# Patient Record
Sex: Female | Born: 1965
Health system: Southern US, Community
[De-identification: ages and names within clinical notes are randomized; demographics above are authoritative.]

## PROBLEM LIST (undated history)

## (undated) HISTORY — PX: ENDOMETRIAL ABLATION: SHX621

## (undated) HISTORY — PX: TUBAL LIGATION: SHX77

---

## 1999-01-26 ENCOUNTER — Other Ambulatory Visit: Admission: RE | Admit: 1999-01-26 | Discharge: 1999-01-26 | Payer: Self-pay | Admitting: Obstetrics and Gynecology

## 1999-04-22 ENCOUNTER — Ambulatory Visit (HOSPITAL_COMMUNITY): Admission: RE | Admit: 1999-04-22 | Discharge: 1999-04-22 | Payer: Self-pay | Admitting: Obstetrics and Gynecology

## 1999-06-19 ENCOUNTER — Inpatient Hospital Stay (HOSPITAL_COMMUNITY): Admission: AD | Admit: 1999-06-19 | Discharge: 1999-06-19 | Payer: Self-pay | Admitting: Obstetrics and Gynecology

## 1999-06-26 ENCOUNTER — Inpatient Hospital Stay (HOSPITAL_COMMUNITY): Admission: AD | Admit: 1999-06-26 | Discharge: 1999-07-12 | Payer: Self-pay | Admitting: *Deleted

## 1999-06-29 ENCOUNTER — Encounter: Payer: Self-pay | Admitting: Obstetrics and Gynecology

## 1999-07-06 ENCOUNTER — Encounter: Payer: Self-pay | Admitting: *Deleted

## 1999-07-13 ENCOUNTER — Encounter: Admission: RE | Admit: 1999-07-13 | Discharge: 1999-10-11 | Payer: Self-pay | Admitting: Obstetrics and Gynecology

## 1999-08-05 ENCOUNTER — Other Ambulatory Visit: Admission: RE | Admit: 1999-08-05 | Discharge: 1999-08-05 | Payer: Self-pay | Admitting: Obstetrics and Gynecology

## 1999-09-04 ENCOUNTER — Ambulatory Visit (HOSPITAL_COMMUNITY): Admission: RE | Admit: 1999-09-04 | Discharge: 1999-09-04 | Payer: Self-pay | Admitting: Obstetrics and Gynecology

## 1999-10-13 ENCOUNTER — Encounter: Admission: RE | Admit: 1999-10-13 | Discharge: 2000-01-11 | Payer: Self-pay | Admitting: Obstetrics and Gynecology

## 2000-02-10 ENCOUNTER — Encounter (HOSPITAL_COMMUNITY): Admission: RE | Admit: 2000-02-10 | Discharge: 2000-05-10 | Payer: Self-pay | Admitting: Obstetrics and Gynecology

## 2000-05-12 ENCOUNTER — Encounter: Admission: RE | Admit: 2000-05-12 | Discharge: 2000-08-10 | Payer: Self-pay | Admitting: Obstetrics and Gynecology

## 2000-08-11 ENCOUNTER — Encounter: Admission: RE | Admit: 2000-08-11 | Discharge: 2000-10-05 | Payer: Self-pay | Admitting: Obstetrics and Gynecology

## 2002-10-29 ENCOUNTER — Encounter: Payer: Self-pay | Admitting: Family Medicine

## 2002-10-29 ENCOUNTER — Encounter: Admission: RE | Admit: 2002-10-29 | Discharge: 2002-10-29 | Payer: Self-pay | Admitting: Family Medicine

## 2003-01-11 ENCOUNTER — Other Ambulatory Visit: Admission: RE | Admit: 2003-01-11 | Discharge: 2003-01-11 | Payer: Self-pay | Admitting: Obstetrics and Gynecology

## 2004-02-15 ENCOUNTER — Other Ambulatory Visit: Admission: RE | Admit: 2004-02-15 | Discharge: 2004-02-15 | Payer: Self-pay | Admitting: Obstetrics and Gynecology

## 2005-02-26 ENCOUNTER — Other Ambulatory Visit: Admission: RE | Admit: 2005-02-26 | Discharge: 2005-02-26 | Payer: Self-pay | Admitting: Obstetrics and Gynecology

## 2006-04-22 ENCOUNTER — Other Ambulatory Visit: Admission: RE | Admit: 2006-04-22 | Discharge: 2006-04-22 | Payer: Self-pay | Admitting: Obstetrics and Gynecology

## 2010-09-26 ENCOUNTER — Other Ambulatory Visit: Payer: Self-pay | Admitting: Obstetrics and Gynecology

## 2010-09-26 DIAGNOSIS — Z1231 Encounter for screening mammogram for malignant neoplasm of breast: Secondary | ICD-10-CM

## 2010-09-26 DIAGNOSIS — Z1239 Encounter for other screening for malignant neoplasm of breast: Secondary | ICD-10-CM

## 2010-10-14 ENCOUNTER — Ambulatory Visit
Admission: RE | Admit: 2010-10-14 | Discharge: 2010-10-14 | Disposition: A | Discharge status: Home / Self Care | Payer: 59 | Source: Ambulatory Visit | Attending: Obstetrics and Gynecology | Admitting: Obstetrics and Gynecology

## 2010-10-14 DIAGNOSIS — Z1231 Encounter for screening mammogram for malignant neoplasm of breast: Secondary | ICD-10-CM

## 2011-11-19 ENCOUNTER — Ambulatory Visit: Payer: Self-pay | Admitting: Obstetrics and Gynecology

## 2011-12-02 ENCOUNTER — Ambulatory Visit (INDEPENDENT_AMBULATORY_CARE_PROVIDER_SITE_OTHER): Payer: 59 | Admitting: Obstetrics and Gynecology

## 2011-12-02 DIAGNOSIS — Z01419 Encounter for gynecological examination (general) (routine) without abnormal findings: Secondary | ICD-10-CM

## 2011-12-31 ENCOUNTER — Other Ambulatory Visit: Payer: Self-pay | Admitting: Obstetrics and Gynecology

## 2011-12-31 ENCOUNTER — Telehealth: Payer: Self-pay | Admitting: Obstetrics and Gynecology

## 2011-12-31 DIAGNOSIS — Z1231 Encounter for screening mammogram for malignant neoplasm of breast: Secondary | ICD-10-CM

## 2011-12-31 NOTE — Telephone Encounter (Signed)
Routed to laura

## 2012-01-03 NOTE — Telephone Encounter (Signed)
LMTC @ 10:15  ld

## 2012-01-07 ENCOUNTER — Ambulatory Visit
Admission: RE | Admit: 2012-01-07 | Discharge: 2012-01-07 | Disposition: A | Discharge status: Home / Self Care | Payer: 59 | Source: Ambulatory Visit | Attending: Obstetrics and Gynecology | Admitting: Obstetrics and Gynecology

## 2012-01-07 DIAGNOSIS — Z1231 Encounter for screening mammogram for malignant neoplasm of breast: Secondary | ICD-10-CM

## 2012-01-10 ENCOUNTER — Telehealth: Payer: Self-pay | Admitting: Obstetrics and Gynecology

## 2012-01-10 NOTE — Telephone Encounter (Deleted)
Triage received 

## 2012-01-10 NOTE — Telephone Encounter (Signed)
Laura received 

## 2012-01-11 ENCOUNTER — Other Ambulatory Visit: Payer: Self-pay | Admitting: Obstetrics and Gynecology

## 2012-01-11 DIAGNOSIS — R928 Other abnormal and inconclusive findings on diagnostic imaging of breast: Secondary | ICD-10-CM

## 2012-01-11 NOTE — Telephone Encounter (Signed)
Explained ASCUS to patient.  ld

## 2012-01-14 ENCOUNTER — Ambulatory Visit
Admission: RE | Admit: 2012-01-14 | Discharge: 2012-01-14 | Disposition: A | Discharge status: Home / Self Care | Payer: 59 | Source: Ambulatory Visit | Attending: Obstetrics and Gynecology | Admitting: Obstetrics and Gynecology

## 2012-01-14 DIAGNOSIS — R928 Other abnormal and inconclusive findings on diagnostic imaging of breast: Secondary | ICD-10-CM

## 2013-01-30 ENCOUNTER — Other Ambulatory Visit: Payer: Self-pay | Admitting: Obstetrics and Gynecology

## 2013-01-30 DIAGNOSIS — Z1231 Encounter for screening mammogram for malignant neoplasm of breast: Secondary | ICD-10-CM

## 2013-03-06 ENCOUNTER — Ambulatory Visit: Payer: 59

## 2013-03-06 ENCOUNTER — Ambulatory Visit
Admission: RE | Admit: 2013-03-06 | Discharge: 2013-03-06 | Disposition: A | Discharge status: Home / Self Care | Payer: 59 | Source: Ambulatory Visit | Attending: Obstetrics and Gynecology | Admitting: Obstetrics and Gynecology

## 2013-03-06 DIAGNOSIS — Z1231 Encounter for screening mammogram for malignant neoplasm of breast: Secondary | ICD-10-CM

## 2014-03-22 ENCOUNTER — Other Ambulatory Visit: Payer: Self-pay

## 2014-03-22 DIAGNOSIS — Z1231 Encounter for screening mammogram for malignant neoplasm of breast: Secondary | ICD-10-CM

## 2014-04-03 ENCOUNTER — Ambulatory Visit
Admission: RE | Admit: 2014-04-03 | Discharge: 2014-04-03 | Disposition: A | Discharge status: Home / Self Care | Payer: 59 | Source: Ambulatory Visit

## 2014-04-03 DIAGNOSIS — Z1231 Encounter for screening mammogram for malignant neoplasm of breast: Secondary | ICD-10-CM

## 2014-04-26 ENCOUNTER — Other Ambulatory Visit: Payer: Self-pay | Admitting: Family Medicine

## 2014-04-26 DIAGNOSIS — N63 Unspecified lump in unspecified breast: Secondary | ICD-10-CM

## 2014-05-01 ENCOUNTER — Ambulatory Visit
Admission: RE | Admit: 2014-05-01 | Discharge: 2014-05-01 | Disposition: A | Discharge status: Home / Self Care | Payer: 59 | Source: Ambulatory Visit | Attending: Family Medicine | Admitting: Family Medicine

## 2014-05-01 DIAGNOSIS — N63 Unspecified lump in unspecified breast: Secondary | ICD-10-CM

## 2014-09-24 ENCOUNTER — Other Ambulatory Visit: Payer: Self-pay | Admitting: Obstetrics and Gynecology

## 2014-09-24 DIAGNOSIS — N63 Unspecified lump in unspecified breast: Secondary | ICD-10-CM

## 2014-09-26 ENCOUNTER — Ambulatory Visit
Admission: RE | Admit: 2014-09-26 | Discharge: 2014-09-26 | Disposition: A | Discharge status: Home / Self Care | Payer: 59 | Source: Ambulatory Visit | Attending: Obstetrics and Gynecology | Admitting: Obstetrics and Gynecology

## 2014-09-26 ENCOUNTER — Other Ambulatory Visit: Payer: Self-pay

## 2014-09-26 DIAGNOSIS — N63 Unspecified lump in unspecified breast: Secondary | ICD-10-CM

## 2014-10-01 ENCOUNTER — Other Ambulatory Visit: Payer: Self-pay

## 2015-04-25 ENCOUNTER — Other Ambulatory Visit: Payer: Self-pay

## 2015-04-25 DIAGNOSIS — Z1231 Encounter for screening mammogram for malignant neoplasm of breast: Secondary | ICD-10-CM

## 2015-05-20 ENCOUNTER — Ambulatory Visit: Payer: 59

## 2015-07-17 ENCOUNTER — Ambulatory Visit
Admission: RE | Admit: 2015-07-17 | Discharge: 2015-07-17 | Disposition: A | Discharge status: Home / Self Care | Payer: 59 | Source: Ambulatory Visit

## 2015-07-17 DIAGNOSIS — Z1231 Encounter for screening mammogram for malignant neoplasm of breast: Secondary | ICD-10-CM

## 2016-09-28 ENCOUNTER — Other Ambulatory Visit: Payer: Self-pay | Admitting: Obstetrics and Gynecology

## 2016-09-28 DIAGNOSIS — Z1231 Encounter for screening mammogram for malignant neoplasm of breast: Secondary | ICD-10-CM

## 2016-10-05 DIAGNOSIS — Z6821 Body mass index (BMI) 21.0-21.9, adult: Secondary | ICD-10-CM | POA: Diagnosis not present

## 2016-10-05 DIAGNOSIS — Z01419 Encounter for gynecological examination (general) (routine) without abnormal findings: Secondary | ICD-10-CM | POA: Diagnosis not present

## 2016-10-15 ENCOUNTER — Ambulatory Visit
Admission: RE | Admit: 2016-10-15 | Discharge: 2016-10-15 | Disposition: A | Discharge status: Home / Self Care | Payer: 59 | Source: Ambulatory Visit | Attending: Obstetrics and Gynecology | Admitting: Obstetrics and Gynecology

## 2016-10-15 DIAGNOSIS — Z1231 Encounter for screening mammogram for malignant neoplasm of breast: Secondary | ICD-10-CM | POA: Diagnosis not present

## 2016-10-15 IMAGING — MG DIGITAL SCREENING BILATERAL MAMMOGRAM WITH CAD
5 series · 5 of 5 positions shown · non-contrast
Comparison: Previous exam(s).

CLINICAL DATA: Screening.

EXAM:
DIGITAL SCREENING BILATERAL MAMMOGRAM WITH CAD

[R CC]
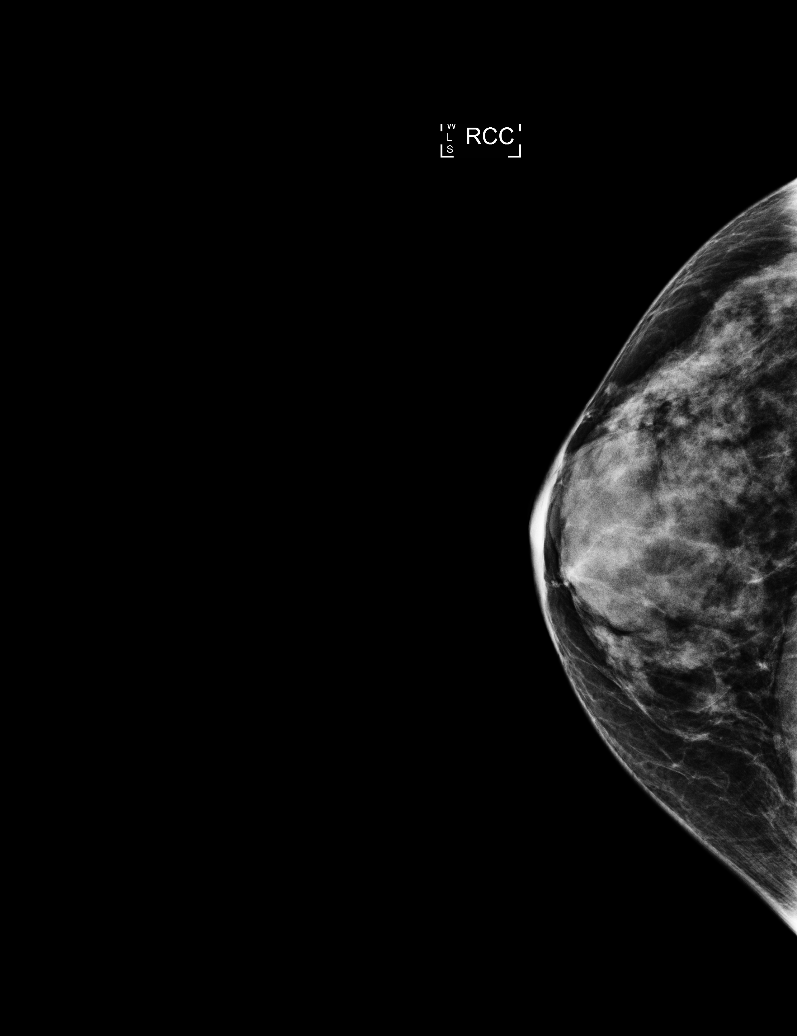

[L MLO (1 of 2)]
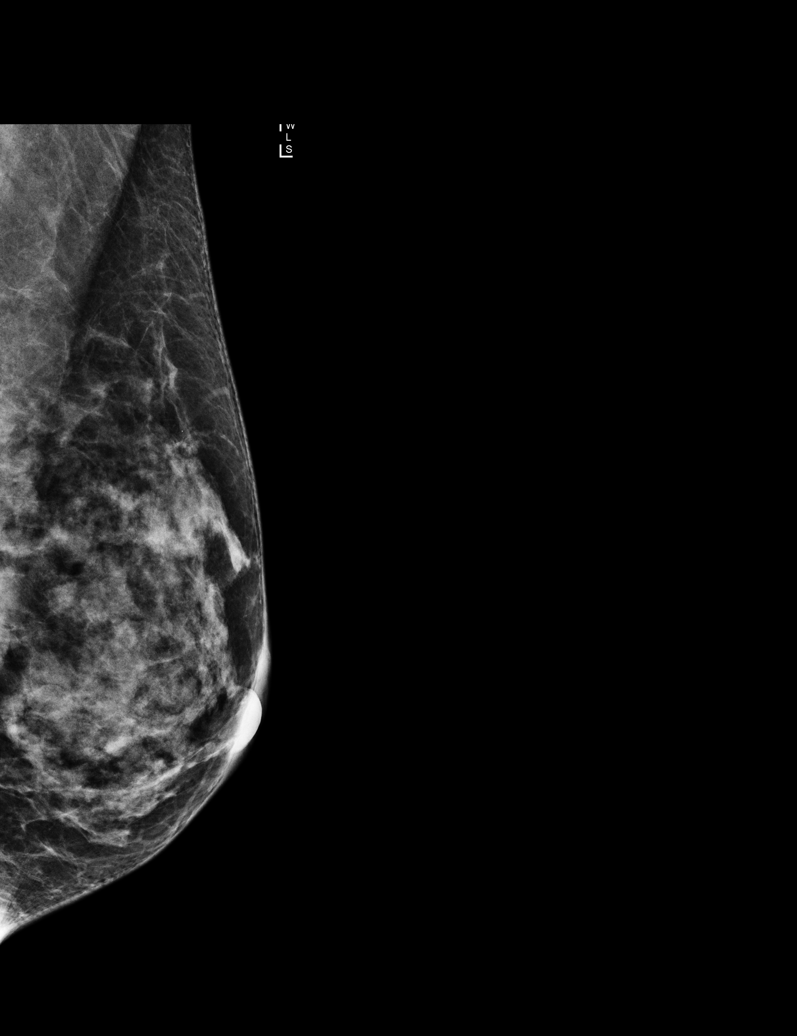

[L MLO (2 of 2)]
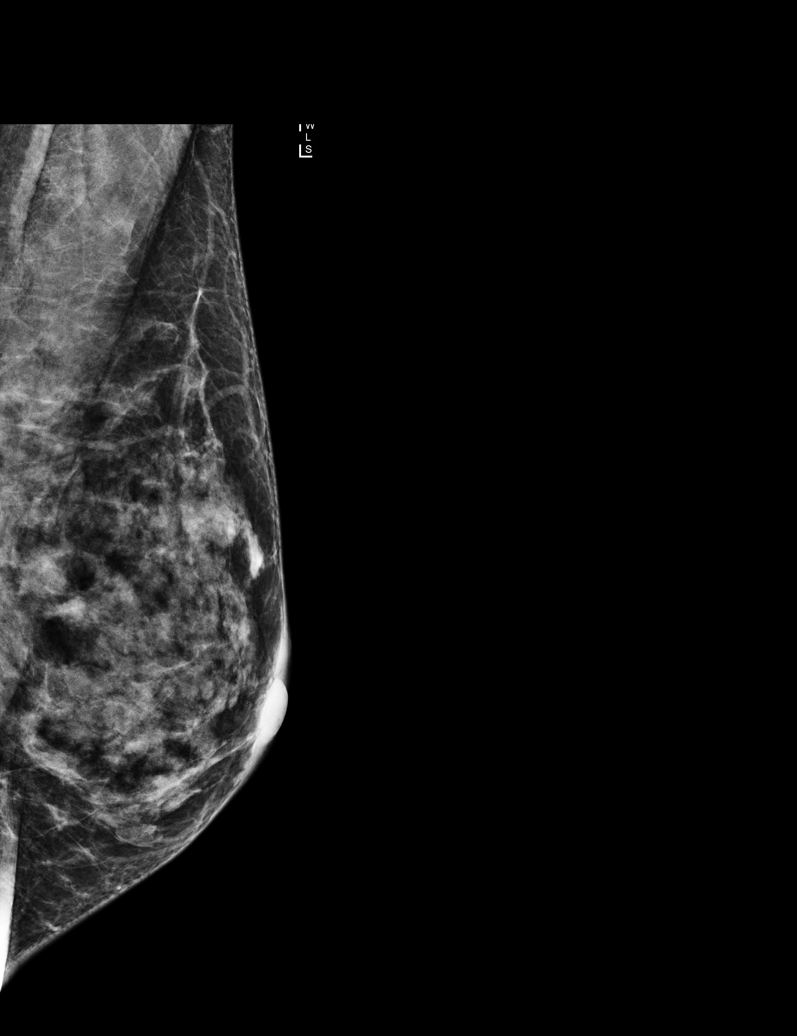

[R MLO]
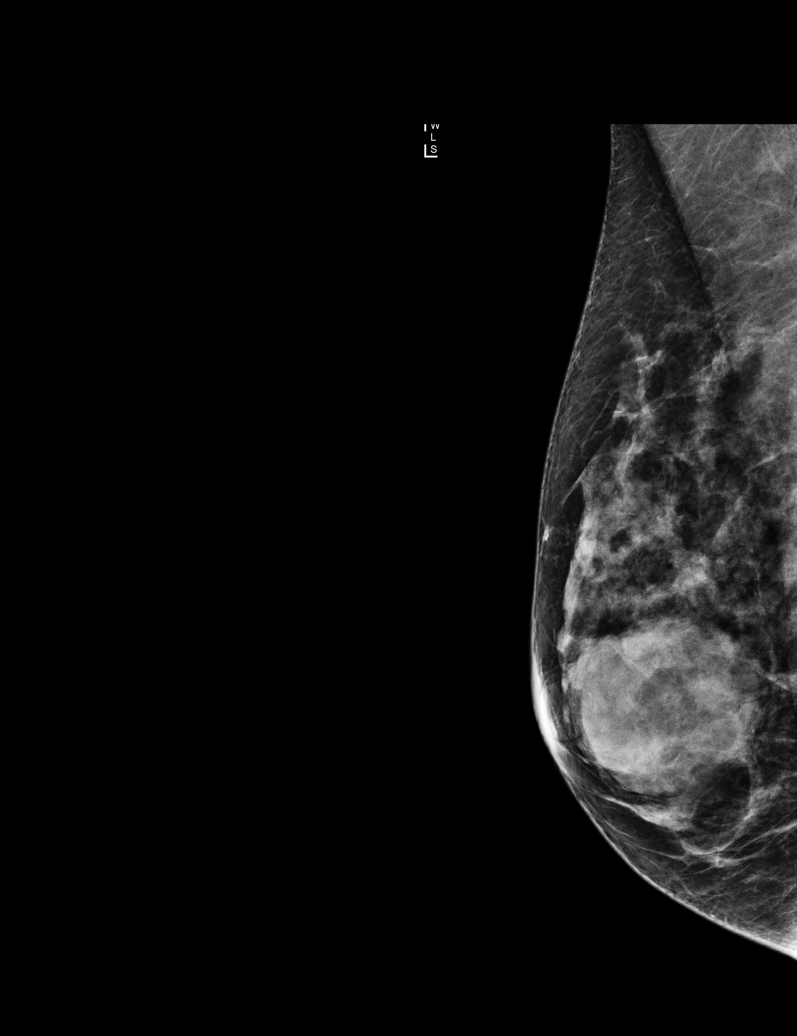

[L CC]
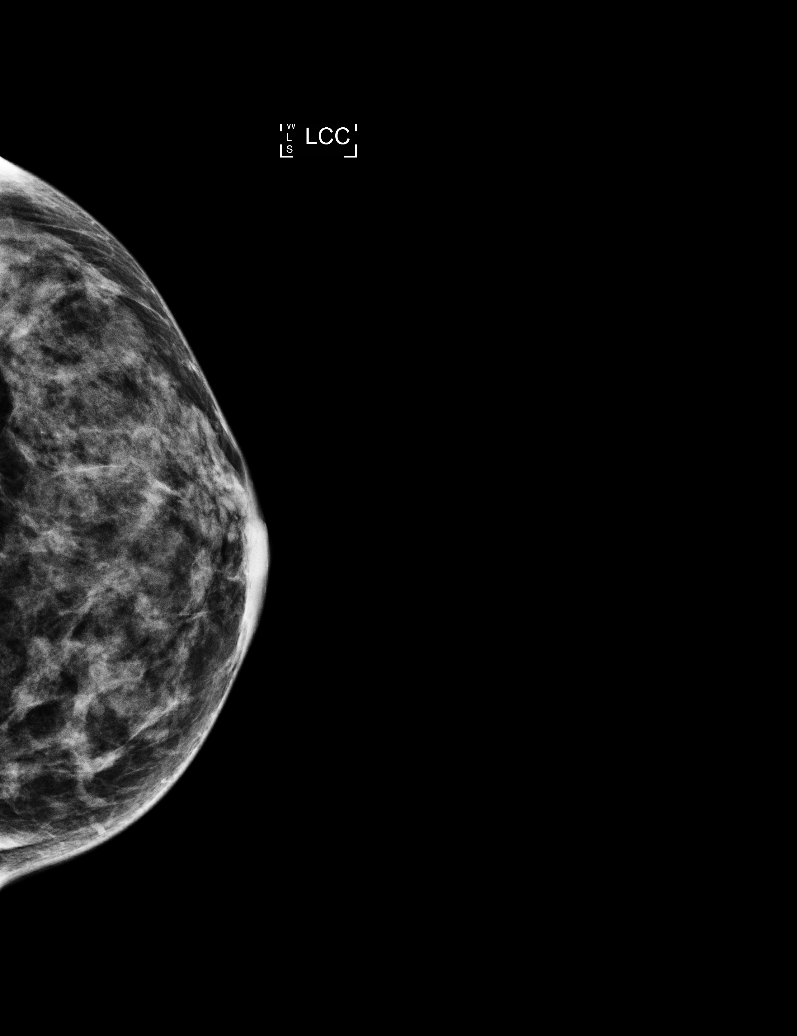

[5 of 5 positions shown; findings below may reference images not displayed]

ACR Breast Density Category c: The breast tissue is heterogeneously
dense, which may obscure small masses.
FINDINGS: There are no findings suspicious for malignancy. Images were
processed with CAD.
IMPRESSION: No mammographic evidence of malignancy. A result letter of this
screening mammogram will be mailed directly to the patient.

RECOMMENDATION:
Screening mammogram in one year. (Code:[0J])

BI-RADS CATEGORY  1: Negative.

## 2017-02-14 ENCOUNTER — Emergency Department (HOSPITAL_COMMUNITY): Payer: 59

## 2017-02-14 ENCOUNTER — Encounter (HOSPITAL_COMMUNITY): Payer: Self-pay | Admitting: Emergency Medicine

## 2017-02-14 ENCOUNTER — Inpatient Hospital Stay (HOSPITAL_COMMUNITY)
Admission: EM | Admit: 2017-02-14 | Discharge: 2017-02-21 | DRG: 392 | Disposition: A | Discharge status: Home / Self Care | Payer: 59 | Attending: Internal Medicine | Admitting: Internal Medicine

## 2017-02-14 DIAGNOSIS — N9489 Other specified conditions associated with female genital organs and menstrual cycle: Secondary | ICD-10-CM | POA: Diagnosis present

## 2017-02-14 DIAGNOSIS — R1110 Vomiting, unspecified: Secondary | ICD-10-CM | POA: Diagnosis not present

## 2017-02-14 DIAGNOSIS — M4306 Spondylolysis, lumbar region: Secondary | ICD-10-CM | POA: Diagnosis present

## 2017-02-14 DIAGNOSIS — I959 Hypotension, unspecified: Secondary | ICD-10-CM | POA: Diagnosis present

## 2017-02-14 DIAGNOSIS — R112 Nausea with vomiting, unspecified: Secondary | ICD-10-CM | POA: Diagnosis not present

## 2017-02-14 DIAGNOSIS — K529 Noninfective gastroenteritis and colitis, unspecified: Secondary | ICD-10-CM | POA: Diagnosis present

## 2017-02-14 DIAGNOSIS — A084 Viral intestinal infection, unspecified: Secondary | ICD-10-CM | POA: Diagnosis not present

## 2017-02-14 DIAGNOSIS — K56609 Unspecified intestinal obstruction, unspecified as to partial versus complete obstruction: Secondary | ICD-10-CM | POA: Diagnosis present

## 2017-02-14 DIAGNOSIS — Z9851 Tubal ligation status: Secondary | ICD-10-CM

## 2017-02-14 DIAGNOSIS — R110 Nausea: Secondary | ICD-10-CM | POA: Diagnosis not present

## 2017-02-14 DIAGNOSIS — R109 Unspecified abdominal pain: Secondary | ICD-10-CM | POA: Diagnosis not present

## 2017-02-14 DIAGNOSIS — D696 Thrombocytopenia, unspecified: Secondary | ICD-10-CM | POA: Diagnosis not present

## 2017-02-14 DIAGNOSIS — Z0189 Encounter for other specified special examinations: Secondary | ICD-10-CM

## 2017-02-14 DIAGNOSIS — R001 Bradycardia, unspecified: Secondary | ICD-10-CM | POA: Diagnosis present

## 2017-02-14 DIAGNOSIS — D649 Anemia, unspecified: Secondary | ICD-10-CM | POA: Diagnosis present

## 2017-02-14 DIAGNOSIS — K2970 Gastritis, unspecified, without bleeding: Secondary | ICD-10-CM | POA: Diagnosis not present

## 2017-02-14 DIAGNOSIS — E876 Hypokalemia: Secondary | ICD-10-CM | POA: Diagnosis not present

## 2017-02-14 LAB — COMPREHENSIVE METABOLIC PANEL
ALK PHOS: 41 U/L (ref 38–126)
ALT: 13 U/L — AB (ref 14–54)
AST: 22 U/L (ref 15–41)
Albumin: 4.5 g/dL (ref 3.5–5.0)
Anion gap: 11 (ref 5–15)
BUN: 14 mg/dL (ref 6–20)
CO2: 21 mmol/L — AB (ref 22–32)
CREATININE: 0.87 mg/dL (ref 0.44–1.00)
Calcium: 8.8 mg/dL — ABNORMAL LOW (ref 8.9–10.3)
Chloride: 108 mmol/L (ref 101–111)
Glucose, Bld: 95 mg/dL (ref 65–99)
Potassium: 4 mmol/L (ref 3.5–5.1)
Sodium: 140 mmol/L (ref 135–145)
Total Bilirubin: 1.3 mg/dL — ABNORMAL HIGH (ref 0.3–1.2)
Total Protein: 7.3 g/dL (ref 6.5–8.1)

## 2017-02-14 LAB — CBC
HEMATOCRIT: 36.8 % (ref 36.0–46.0)
Hemoglobin: 12.9 g/dL (ref 12.0–15.0)
MCH: 32.6 pg (ref 26.0–34.0)
MCHC: 35.1 g/dL (ref 30.0–36.0)
MCV: 92.9 fL (ref 78.0–100.0)
PLATELETS: 156 10*3/uL (ref 150–400)
RBC: 3.96 MIL/uL (ref 3.87–5.11)
RDW: 12.4 % (ref 11.5–15.5)
WBC: 8.1 10*3/uL (ref 4.0–10.5)

## 2017-02-14 LAB — LIPASE, BLOOD: Lipase: 29 U/L (ref 11–51)

## 2017-02-14 LAB — I-STAT BETA HCG BLOOD, ED (MC, WL, AP ONLY): I-stat hCG, quantitative: <5 m[IU]/mL (ref <5)

## 2017-02-14 LAB — I-STAT CG4 LACTIC ACID, ED: Lactic Acid, Venous: 1.87 mmol/L (ref 0.5–1.9)

## 2017-02-14 IMAGING — DX DG CHEST 1V PORT
1 series · 1 of 1 positions shown · non-contrast
Comparison: None.

CLINICAL DATA: Abdominal pain beginning this morning. Nausea. Low
back pain.

EXAM:
PORTABLE CHEST 1 VIEW

[chest ap]
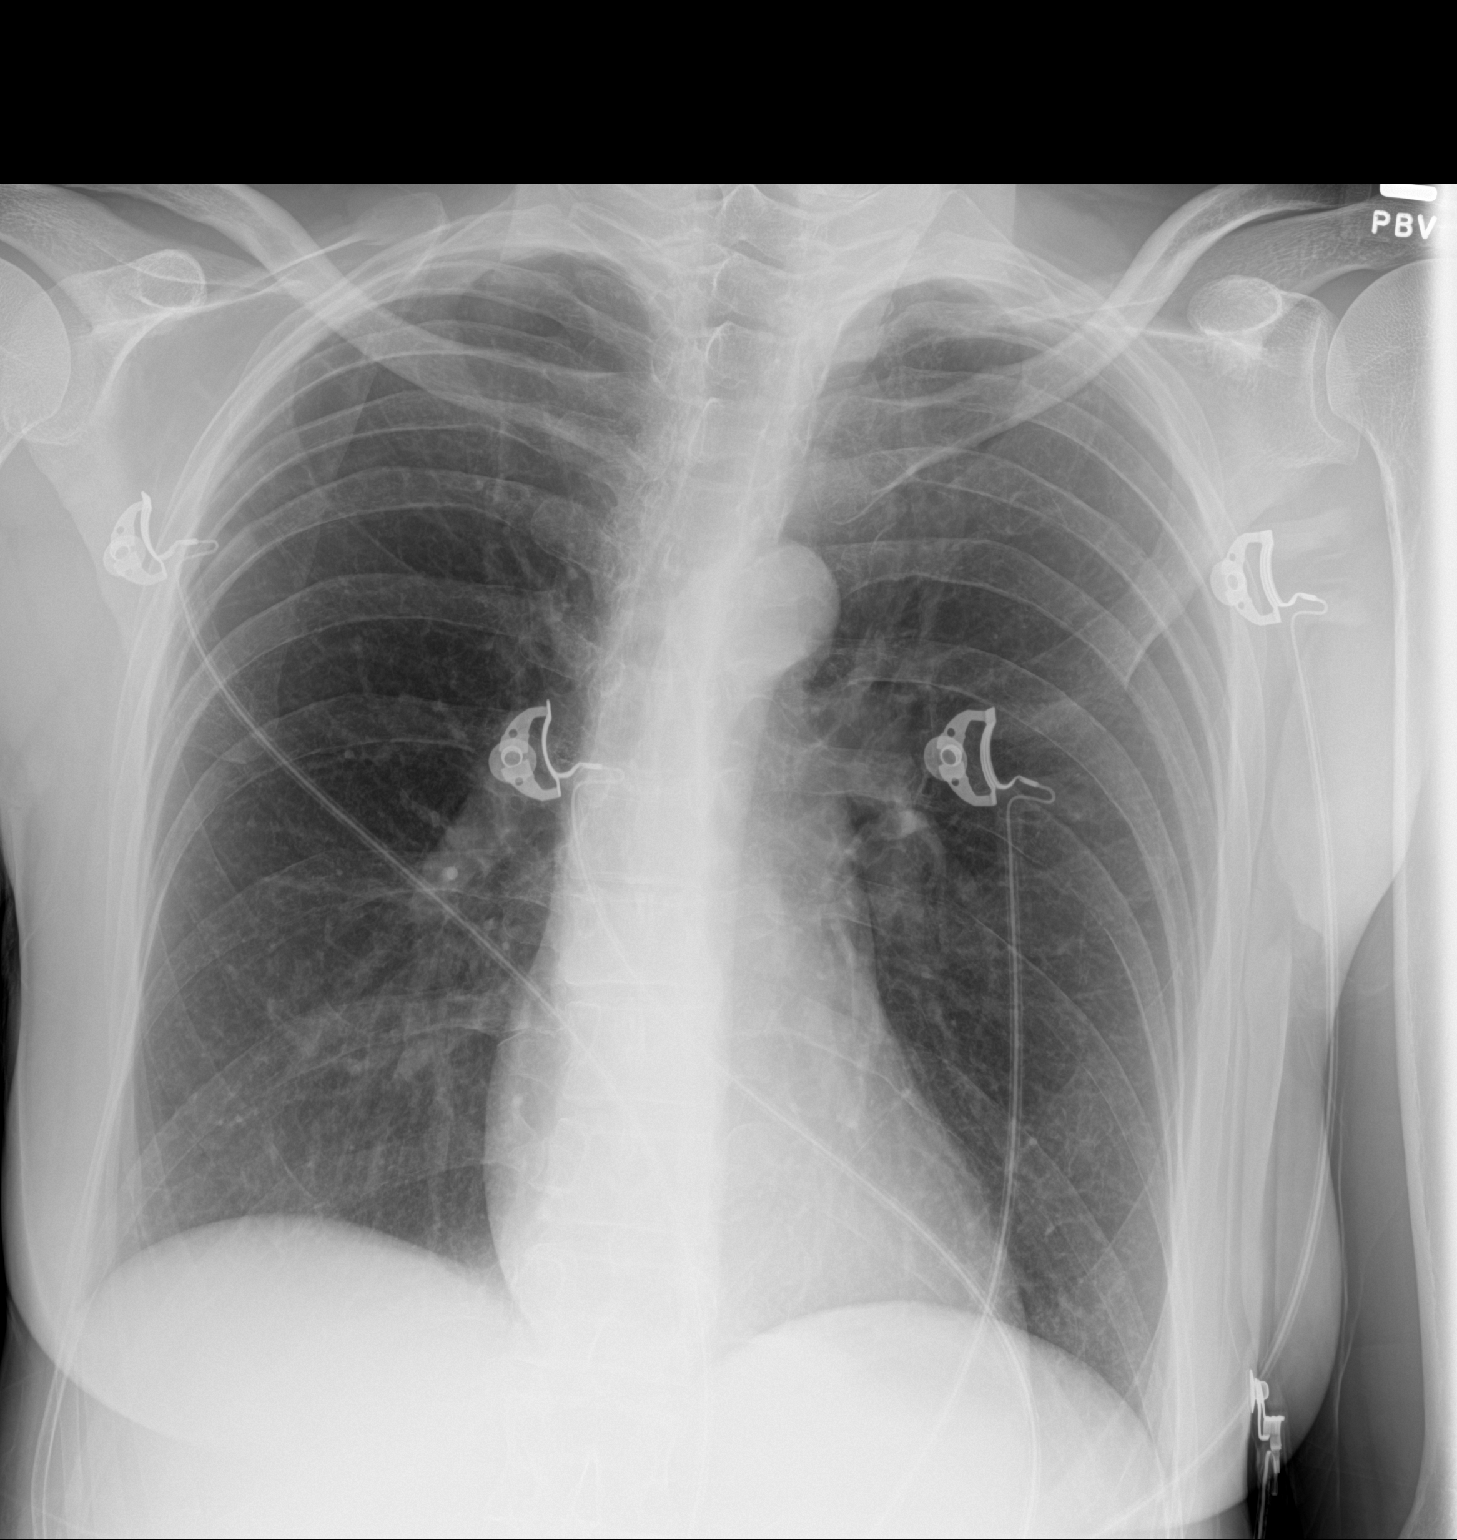

[1 of 1 positions shown; findings below may reference images not displayed]

FINDINGS: Patient slightly rotated to the left. Lungs are normally expanded
without consolidation or effusion. Cardiomediastinal silhouette is
within normal. Remaining bones and soft tissue structures are
normal.
IMPRESSION: No active disease.

## 2017-02-14 IMAGING — CT CT ABD-PELV W/ CM
2 of 5 series · 16 of 46 positions shown, 18 images · IV contrast (ISOVUE)
Comparison: None.

CLINICAL DATA: Mid to lower abdominal pain with nausea and vomiting
1-2 days.

EXAM:
CT ABDOMEN AND PELVIS WITH CONTRAST
TECHNIQUE: Multidetector CT imaging of the abdomen and pelvis was performed
using the standard protocol following bolus administration of
intravenous contrast.
CONTRAST:  100mL [RR] IOPAMIDOL ([RR]) INJECTION 61%

[Series 2: abd/pel with · axial · 0.62mm/px · z∈[-429,-94]mm · 13 of 79 slices shown, 15 images]
[im 6/79  soft-tissue]
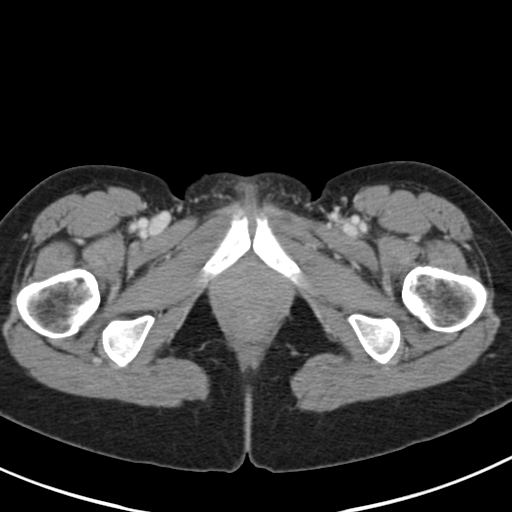
[im 6/79  bone]
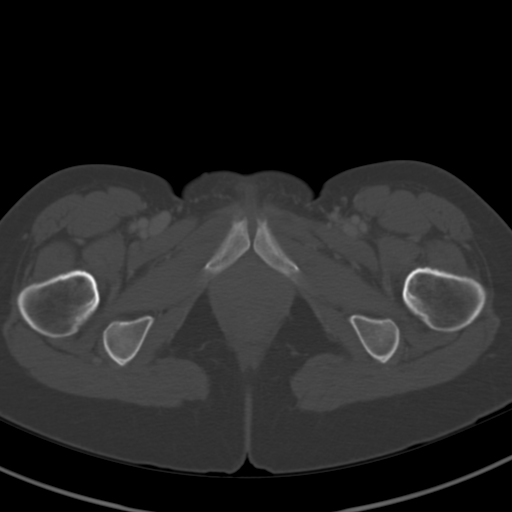
[im 11/79  soft-tissue]
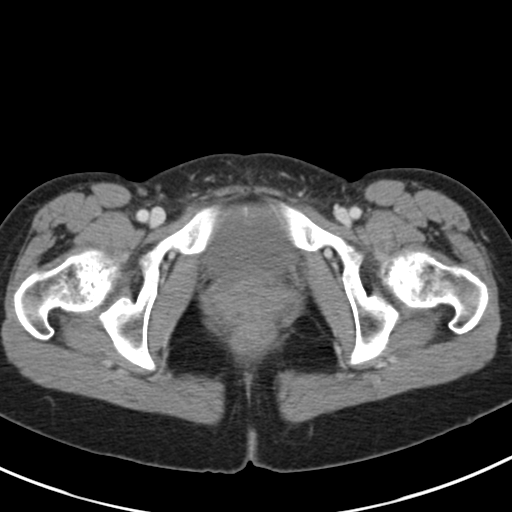
[im 16/79  soft-tissue]
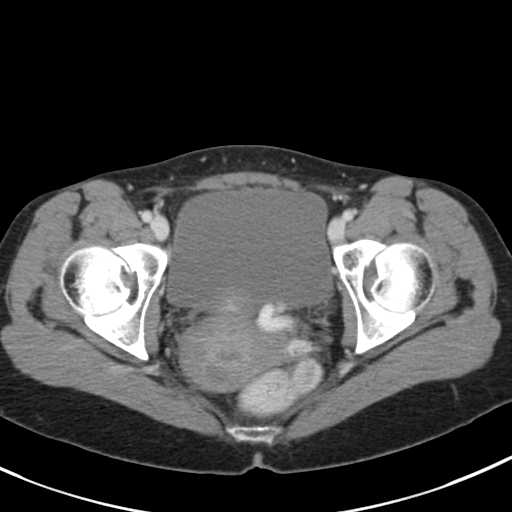
[im 21/79  soft-tissue]
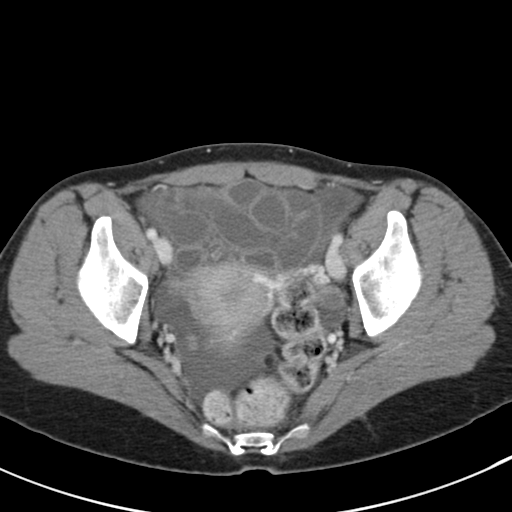
[im 27/79  soft-tissue]
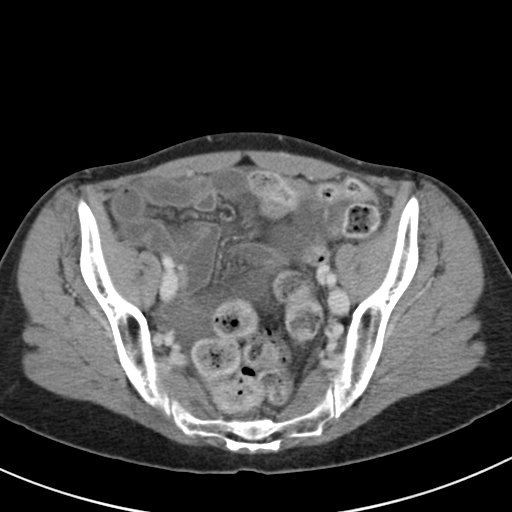
[im 32/79  soft-tissue]
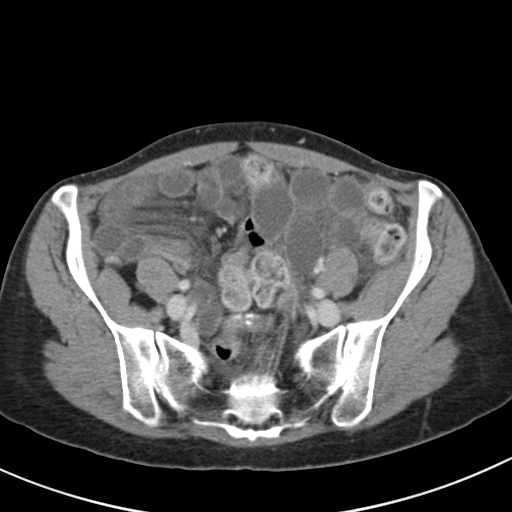
[im 42/79  soft-tissue]
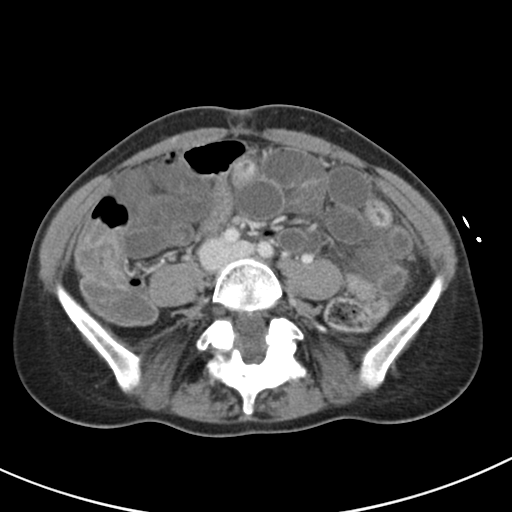
[im 47/79  soft-tissue]
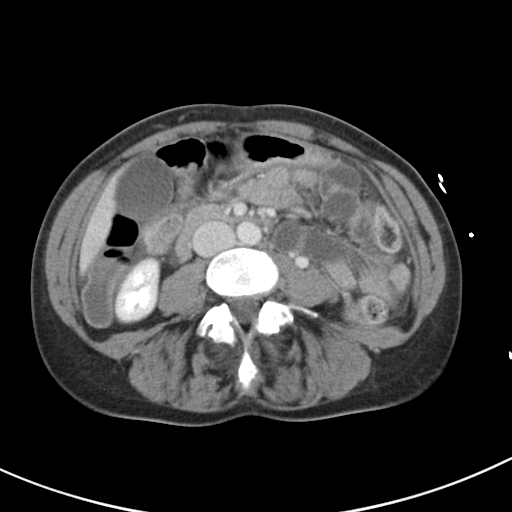
[im 53/79  soft-tissue]
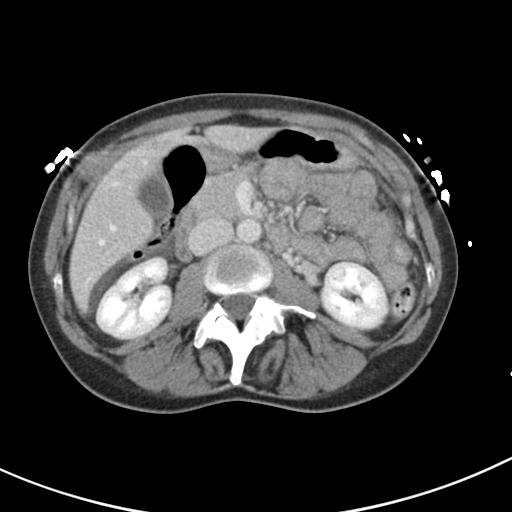
[im 53/79  bone]
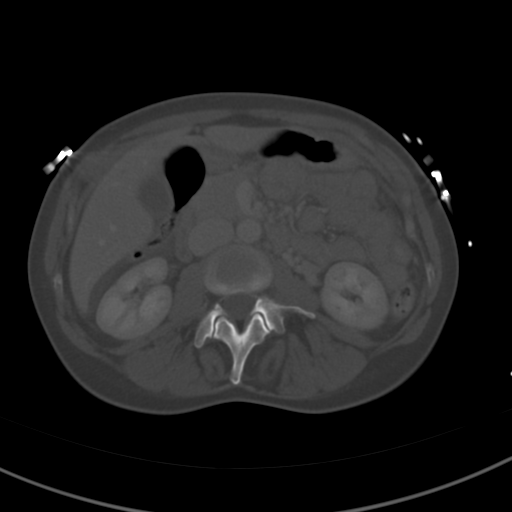
[im 58/79  soft-tissue]
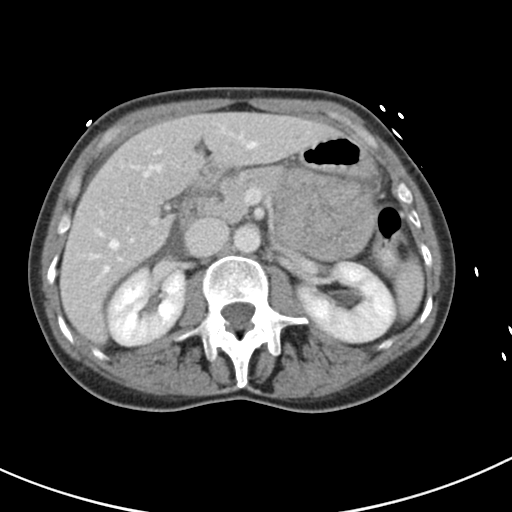
[im 63/79  soft-tissue]
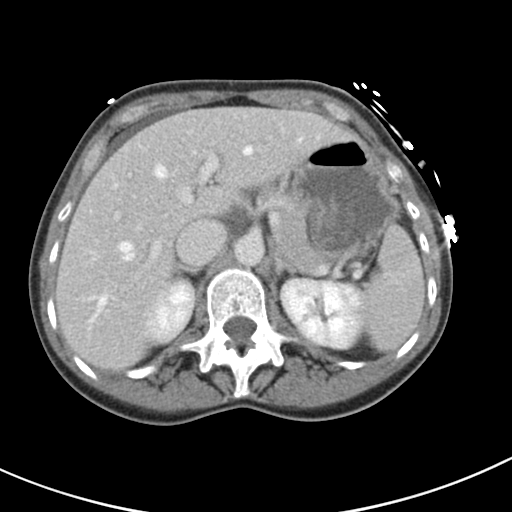
[im 68/79  soft-tissue]
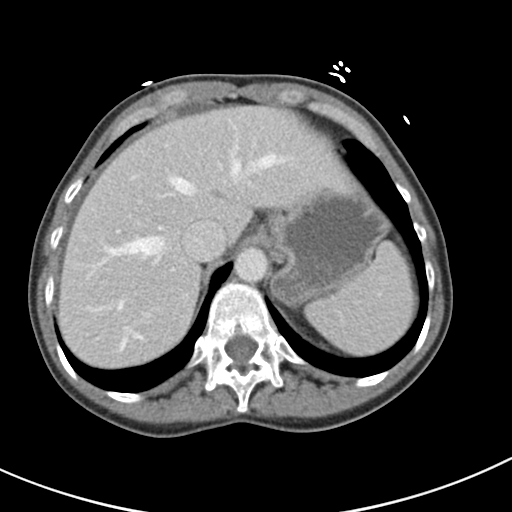
[im 73/79  soft-tissue]
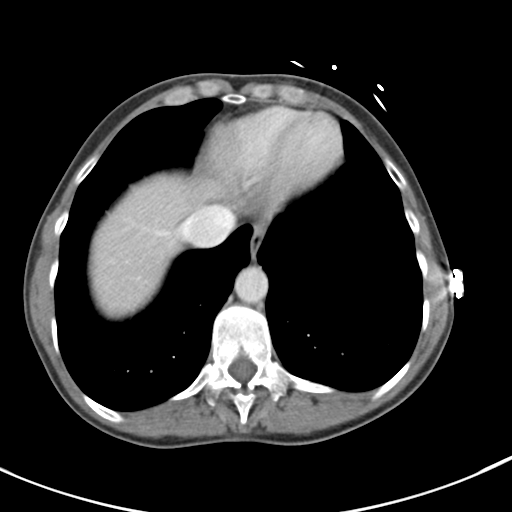

[Series 4: coronal a/|p · coronal · 0.73mm/px · 3 of 132 slices shown]
[im 44/132  soft-tissue]
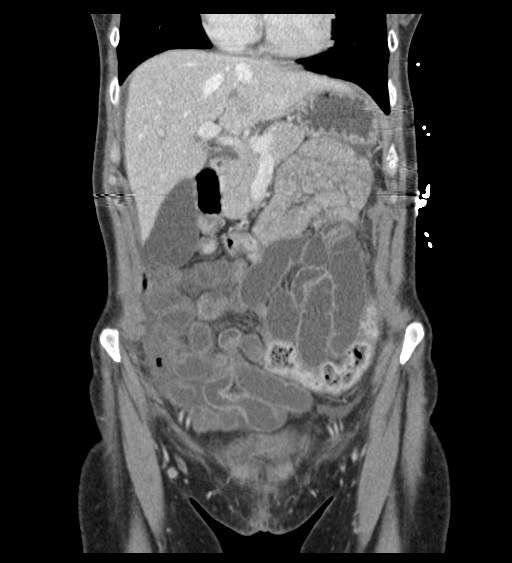
[im 59/132  soft-tissue]
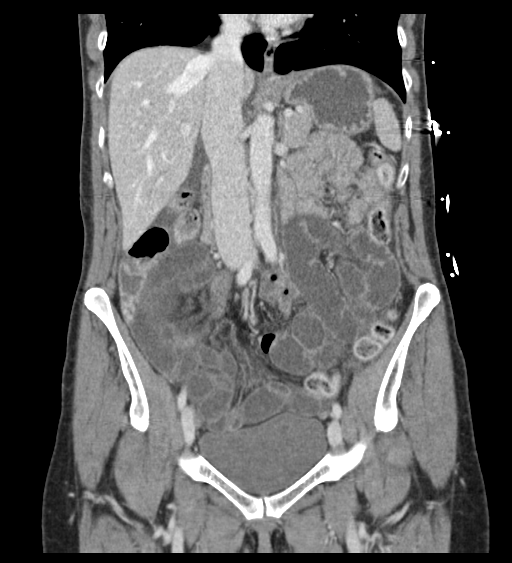
[im 73/132  soft-tissue]
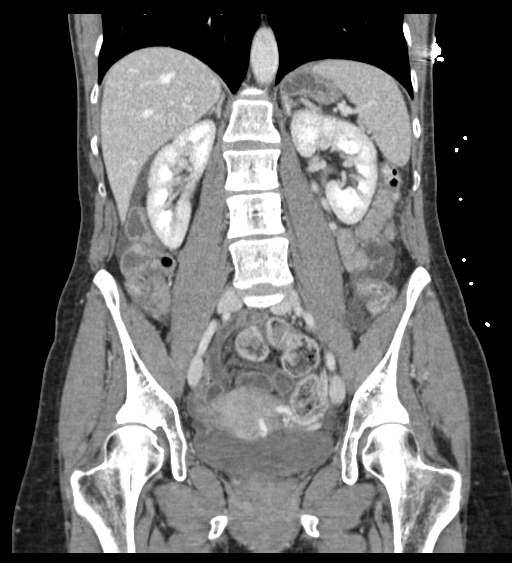

[16 of 46 positions shown; findings below may reference images not displayed]

FINDINGS: Lower chest: Lung bases are normal.

Hepatobiliary: Subcentimeter hypodensity over the right lobe of the
liver too small to characterize but likely a cyst. Gallbladder and
biliary tree are within normal.

Pancreas: Within normal.

Spleen: Within normal.

Adrenals/Urinary Tract: Adrenal glands are normal. Kidneys are
normal size without hydronephrosis or nephrolithiasis. Ureters and
bladder are normal.

Stomach/Bowel: Stomach is within normal. There are multiple
fluid-filled prominent but nondilated mid to distal small bowel
loops. Appendix is difficult to visualize but likely within normal.
Colon is unremarkable.

Vascular/Lymphatic: Arterial structures are within normal. There are
prominent pelvic/periuterine veins and prominent left
ovarian/gonadal vein. No evidence of adenopathy.

Reproductive: Uterus and ovaries are unremarkable. There is mild to
moderate free fluid in the pelvis which is simple in nature.

Other: No evidence of focal inflammatory change or free peritoneal
air.

Musculoskeletal: Degenerative change with disc disease at the L5-S1
level. Grade 2 anterolisthesis of L5 on S1 due to bilateral L5
spondylolysis.
IMPRESSION: No acute findings in the abdomen/ pelvis.

Mild to moderate simple free fluid in the pelvis.

Prominent pelvic/ periuterine veins and left ovarian vein which can
be seen with pelvic congestion syndrome.

Subcentimeter right liver hypodensity too small to characterize but
likely a cyst.

Grade 2 anterolisthesis of L5 on S1 due to bilateral L5
spondylolysis.

## 2017-02-14 MED ORDER — ONDANSETRON HCL 4 MG/2ML IJ SOLN
4.0000 mg | Freq: Once | INTRAMUSCULAR | Status: AC
  Administered 2017-02-14: 4 mg via INTRAVENOUS
  Filled 2017-02-14: qty 2

## 2017-02-14 MED ORDER — IOPAMIDOL (ISOVUE-300) INJECTION 61%
INTRAVENOUS | Status: AC
  Filled 2017-02-14: qty 100

## 2017-02-14 MED ORDER — HYDROMORPHONE HCL 1 MG/ML IJ SOLN
1.0000 mg | Freq: Once | INTRAMUSCULAR | Status: AC
  Administered 2017-02-14: 1 mg via INTRAVENOUS
  Filled 2017-02-14: qty 1

## 2017-02-14 MED ORDER — SODIUM CHLORIDE 0.9 % IV BOLUS (SEPSIS)
1000.0000 mL | Freq: Once | INTRAVENOUS | Status: AC
  Administered 2017-02-14: 1000 mL via INTRAVENOUS

## 2017-02-14 MED ORDER — FENTANYL CITRATE (PF) 100 MCG/2ML IJ SOLN
50.0000 ug | Freq: Once | INTRAMUSCULAR | Status: AC
Start: 2017-02-14 — End: 2017-02-14
  Administered 2017-02-14: 50 ug via INTRAVENOUS
  Filled 2017-02-14: qty 2

## 2017-02-14 MED ORDER — IOPAMIDOL (ISOVUE-300) INJECTION 61%
100.0000 mL | Freq: Once | INTRAVENOUS | Status: AC | PRN
  Administered 2017-02-14: 100 mL via INTRAVENOUS

## 2017-02-14 NOTE — ED Triage Notes (Signed)
Pt comes from home via EMS with sudden onset of abdominal pain originating around the belly button since 0900.  Some rigidity noted per EMS.  Pt is unable to stand or lay flat. Given 8 mg zofran and 4 mg of morphine in route.  800 cc of NS given as well.  Pt bradycardic and soft hypotension in route. Several episodes of emesis since this morning.

## 2017-02-14 NOTE — ED Notes (Signed)
ED Provider at bedside. 

## 2017-02-14 NOTE — H&P (Signed)
History and Physical    Gina Fisher MHD:[id number] DOB: [birth year] DOA: 02/14/2017  PCP: [name], MD Consultants:  [facility] - OB/GYN Patient coming from: Home - lives with husband and 2 children; NOK: husband, [phone]  Chief Complaint: abdominal pain, n/v  HPI: Gina Fisher is a 51 y.o. female with no significant medical history presenting with apparent viral gastroenteritis.  Husband had a stomach virus over the weekend; they stayed in a hotel room together.  She awoke this AM with mild abdominal pain and nausea. Went to work and about 930 developed more severe abdominal pain and n/v.  Took Zofran and went to sleep for a few hours.  Awoke and had more n/v with severe abdominal pain.  "It just wouldn't go away."  Pain started radiating into her lower back and she just couldn't get relief.  She finally told her husband she had to come to the ER, called 911.  They drove back from [place] yesterday and she felt well at that time.  ED Course: Required multiple doses of narcotic analgesia, also with mild persistent hypotension  Review of Systems: As per HPI; otherwise review of systems reviewed and negative.   Ambulatory Status:  Ambulates without assistance  History reviewed. No pertinent past medical history.  Past Surgical History:  Procedure Laterality Date  . ENDOMETRIAL ABLATION    . TUBAL LIGATION      Social History   Social History  . Marital status: Married    Spouse name: N/A  . Number of children: N/A  . Years of education: N/A   Occupational History  . Glass blower/designer    Social History Main Topics  . Smoking status: Never Smoker  . Smokeless tobacco: Never Used  . Alcohol use Yes     Comment: occasional  . Drug use: No  . Sexual activity: No   Other Topics Concern  . Not on file   Social History Narrative  . No narrative on file    No Known Allergies  History reviewed. No pertinent family history.  Prior to Admission medications   Not on File      Physical Exam: Vitals:   02/14/17 2315 02/14/17 2330 02/14/17 2345 02/15/17 0000  BP:    98/65  Pulse: (!) 44 (!) 46 (!) 51 (!) 43  Resp: (!) 9 (!) 9 18 10   Temp:      TempSrc:      SpO2: 99% 99% 99% 99%  Weight:      Height:         General: Appears calm and comfortable and is NAD Eyes:  PERRL, EOMI, normal lids, iris ENT:  grossly normal hearing, lips & tongue, mmm Neck:  no LAD, masses or thyromegaly Cardiovascular:  RRR, no m/r/g. No LE edema.  Respiratory:  CTA bilaterally, no w/r/r. Normal respiratory effort. Abdomen:  soft, ntnd, NABS Skin:  no rash or induration seen on limited exam Musculoskeletal:  grossly normal tone BUE/BLE, good ROM, no bony abnormality Psychiatric:  grossly normal mood and affect, speech fluent and appropriate, AOx3 Neurologic:  CN 2-12 grossly intact, moves all extremities in coordinated fashion, sensation intact  Labs on Admission: I have personally reviewed following labs and imaging studies  CBC:  Recent Labs Lab 02/14/17 1729  WBC 8.1  HGB 12.9  HCT 36.8  MCV 92.9  PLT 144   Basic Metabolic Panel:  Recent Labs Lab 02/14/17 1824  NA 140  K 4.0  CL 108  CO2 21*  GLUCOSE  95  BUN 14  CREATININE 0.87  CALCIUM 8.8*   GFR: Estimated Creatinine Clearance: 73.7 mL/min (by C-G formula based on SCr of 0.87 mg/dL). Liver Function Tests:  Recent Labs Lab 02/14/17 1824  AST 22  ALT 13*  ALKPHOS 41  BILITOT 1.3*  PROT 7.3  ALBUMIN 4.5    Recent Labs Lab 02/14/17 1824  LIPASE 29   No results for input(s): AMMONIA in the last 168 hours. Coagulation Profile: No results for input(s): INR, PROTIME in the last 168 hours. Cardiac Enzymes: No results for input(s): CKTOTAL, CKMB, CKMBINDEX, TROPONINI in the last 168 hours. BNP (last 3 results) No results for input(s): PROBNP in the last 8760 hours. HbA1C: No results for input(s): HGBA1C in the last 72 hours. CBG: No results for input(s): GLUCAP in the last 168  hours. Lipid Profile: No results for input(s): CHOL, HDL, LDLCALC, TRIG, CHOLHDL, LDLDIRECT in the last 72 hours. Thyroid Function Tests: No results for input(s): TSH, T4TOTAL, FREET4, T3FREE, THYROIDAB in the last 72 hours. Anemia Panel: No results for input(s): VITAMINB12, FOLATE, FERRITIN, TIBC, IRON, RETICCTPCT in the last 72 hours. Urine analysis: No results found for: COLORURINE, APPEARANCEUR, LABSPEC, PHURINE, GLUCOSEU, HGBUR, BILIRUBINUR, KETONESUR, PROTEINUR, UROBILINOGEN, NITRITE, LEUKOCYTESUR  Creatinine Clearance: Estimated Creatinine Clearance: 73.7 mL/min (by C-G formula based on SCr of 0.87 mg/dL).  Sepsis Labs: @LABRCNTIP (procalcitonin:4,lacticidven:4) )No results found for this or any previous visit (from the past 240 hour(s)).   Radiological Exams on Admission: Ct Abdomen Pelvis W Contrast  Result Date: 02/14/2017 CLINICAL DATA:  Mid to lower abdominal pain with nausea and vomiting 1-2 days. EXAM: CT ABDOMEN AND PELVIS WITH CONTRAST TECHNIQUE: Multidetector CT imaging of the abdomen and pelvis was performed using the standard protocol following bolus administration of intravenous contrast. CONTRAST:  168mL ISOVUE-300 IOPAMIDOL (ISOVUE-300) INJECTION 61% COMPARISON:  None. FINDINGS: Lower chest: Lung bases are normal. Hepatobiliary: Subcentimeter hypodensity over the right lobe of the liver too small to characterize but likely a cyst. Gallbladder and biliary tree are within normal. Pancreas: Within normal. Spleen: Within normal. Adrenals/Urinary Tract: Adrenal glands are normal. Kidneys are normal size without hydronephrosis or nephrolithiasis. Ureters and bladder are normal. Stomach/Bowel: Stomach is within normal. There are multiple fluid-filled prominent but nondilated mid to distal small bowel loops. Appendix is difficult to visualize but likely within normal. Colon is unremarkable. Vascular/Lymphatic: Arterial structures are within normal. There are prominent  pelvic/periuterine veins and prominent left ovarian/gonadal vein. No evidence of adenopathy. Reproductive: Uterus and ovaries are unremarkable. There is mild to moderate free fluid in the pelvis which is simple in nature. Other: No evidence of focal inflammatory change or free peritoneal air. Musculoskeletal: Degenerative change with disc disease at the L5-S1 level. Grade 2 anterolisthesis of L5 on S1 due to bilateral L5 spondylolysis. IMPRESSION: No acute findings in the abdomen/ pelvis. Mild to moderate simple free fluid in the pelvis. Prominent pelvic/ periuterine veins and left ovarian vein which can be seen with pelvic congestion syndrome. Subcentimeter right liver hypodensity too small to characterize but likely a cyst. Grade 2 anterolisthesis of L5 on S1 due to bilateral L5 spondylolysis. Electronically Signed   By: Marin Olp M.D.   On: 02/14/2017 20:57   Dg Chest Port 1 View  Result Date: 02/14/2017 CLINICAL DATA:  Abdominal pain beginning this morning. Nausea. Low back pain. EXAM: PORTABLE CHEST 1 VIEW COMPARISON:  None. FINDINGS: Patient slightly rotated to the left. Lungs are normally expanded without consolidation or effusion. Cardiomediastinal silhouette is within normal. Remaining bones  and soft tissue structures are normal. IMPRESSION: No active disease. Electronically Signed   By: Marin Olp M.D.   On: 02/14/2017 18:09    Assessment/Plan Principal Problem:   Gastroenteritis    -Patient without chronic medical problems presenting with abdominal pain, n/v -Husband was sick in the same hotel room she was in last weekend with apparent viral gastroenteritis -Evaluation in the ER including CT generally unremarkable -Suspect patient also has viral gastroenteritis -She has not yet developed diarrhea, but this is likely to develop in the next 24 hours -Anticipate spontaneous resolution -Supportive care with IVF, Zofran, Toradol prn pain -Likely improvement in the next 24-48 hours  with discharge to home   DVT prophylaxis: Early ambulation Code Status:  Full - confirmed with patient Family Communication: None present Disposition Plan:  Home once clinically improved Consults called: None  Admission status: It is my clinical opinion that referral for OBSERVATION is reasonable and necessary in this patient based on the above information provided. The aforementioned taken together are felt to place the patient at high risk for further clinical deterioration. However it is anticipated that the patient may be medically stable for discharge from the hospital within 24 to 48 hours.    Karmen Bongo MD [facility]  If 7PM-7AM, please contact night-coverage [email or web address] Password [facility]  02/15/2017, 12:15 AM

## 2017-02-14 NOTE — ED Provider Notes (Signed)
[facility] DEPT Provider Note   CSN: [id number] Arrival date & time: 02/14/17  1704     History   Chief Complaint Chief Complaint  Patient presents with  . Abdominal Pain  . Emesis    HPI Gina Fisher is a 51 y.o. female.  She c/o vomiting followed by abdominal pain, onset this afternoon. No diarrhea. No fever.  She was transferred by EMS, during which time she received IV fluids, IV morphine and oral Zofran. Patient's husband was ill, over the weekend, with diarrhea, following a trip to [place].  She denies blood in emesis, cough, chest pain, weakness or dizziness.  There are no other known modifying factors.  HPI  History reviewed. No pertinent past medical history.  Patient Active Problem List   Diagnosis Date Noted  . Gastroenteritis 02/14/2017    Past Surgical History:  Procedure Laterality Date  . ENDOMETRIAL ABLATION    . TUBAL LIGATION      OB History    No data available       Home Medications    Prior to Admission medications   Not on File    Family History History reviewed. No pertinent family history.  Social History Social History  Substance Use Topics  . Smoking status: Never Smoker  . Smokeless tobacco: Never Used  . Alcohol use Yes     Comment: occasional     Allergies   Patient has no known allergies.   Review of Systems Review of Systems  All other systems reviewed and are negative.    Physical Exam Updated Vital Signs BP (!) 92/59   Pulse (!) 51   Temp 97.6 F (36.4 C) (Oral)   Resp 18   Ht 5\' 7"  (1.702 m)   Wt 59 kg (130 lb)   SpO2 99%   BMI 20.36 kg/m   Physical Exam  Constitutional: She is oriented to person, place, and time. She appears well-developed. She appears distressed (Uncomfortable.).  HENT:  Head: Normocephalic and atraumatic.  Eyes: Conjunctivae and EOM are normal. Pupils are equal, round, and reactive to light.  Neck: Normal range of motion and phonation normal. Neck supple.    Cardiovascular: Normal rate and regular rhythm.   Pulmonary/Chest: Effort normal and breath sounds normal. She exhibits no tenderness.  Abdominal: Soft. She exhibits no distension. There is tenderness (diffuse, mild). There is no guarding.  Hypoactive BS.  Musculoskeletal: Normal range of motion.  Neurological: She is alert and oriented to person, place, and time. She exhibits normal muscle tone.  Skin: Skin is warm and dry. Pallor:    Psychiatric: She has a normal mood and affect. Her behavior is normal. Judgment and thought content normal.  Nursing note and vitals reviewed.    ED Treatments / Results  Labs (all labs ordered are listed, but only abnormal results are displayed) Labs Reviewed  COMPREHENSIVE METABOLIC PANEL - Abnormal; Notable for the following:       Result Value   CO2 21 (*)    Calcium 8.8 (*)    ALT 13 (*)    Total Bilirubin 1.3 (*)    All other components within normal limits  CBC  LIPASE, BLOOD  URINALYSIS, ROUTINE W REFLEX MICROSCOPIC  I-STAT BETA HCG BLOOD, ED ([facility], [facility], [facility] ONLY)  I-STAT CG4 LACTIC ACID, ED    EKG  EKG Interpretation None       Radiology Ct Abdomen Pelvis W Contrast  Result Date: 02/14/2017 CLINICAL DATA:  Mid to lower abdominal pain with  nausea and vomiting 1-2 days. EXAM: CT ABDOMEN AND PELVIS WITH CONTRAST TECHNIQUE: Multidetector CT imaging of the abdomen and pelvis was performed using the standard protocol following bolus administration of intravenous contrast. CONTRAST:  181mL ISOVUE-300 IOPAMIDOL (ISOVUE-300) INJECTION 61% COMPARISON:  None. FINDINGS: Lower chest: Lung bases are normal. Hepatobiliary: Subcentimeter hypodensity over the right lobe of the liver too small to characterize but likely a cyst. Gallbladder and biliary tree are within normal. Pancreas: Within normal. Spleen: Within normal. Adrenals/Urinary Tract: Adrenal glands are normal. Kidneys are normal size without hydronephrosis or nephrolithiasis. Ureters and  bladder are normal. Stomach/Bowel: Stomach is within normal. There are multiple fluid-filled prominent but nondilated mid to distal small bowel loops. Appendix is difficult to visualize but likely within normal. Colon is unremarkable. Vascular/Lymphatic: Arterial structures are within normal. There are prominent pelvic/periuterine veins and prominent left ovarian/gonadal vein. No evidence of adenopathy. Reproductive: Uterus and ovaries are unremarkable. There is mild to moderate free fluid in the pelvis which is simple in nature. Other: No evidence of focal inflammatory change or free peritoneal air. Musculoskeletal: Degenerative change with disc disease at the L5-S1 level. Grade 2 anterolisthesis of L5 on S1 due to bilateral L5 spondylolysis. IMPRESSION: No acute findings in the abdomen/ pelvis. Mild to moderate simple free fluid in the pelvis. Prominent pelvic/ periuterine veins and left ovarian vein which can be seen with pelvic congestion syndrome. Subcentimeter right liver hypodensity too small to characterize but likely a cyst. Grade 2 anterolisthesis of L5 on S1 due to bilateral L5 spondylolysis. Electronically Signed   By: Marin Olp M.D.   On: 02/14/2017 20:57   Dg Chest Port 1 View  Result Date: 02/14/2017 CLINICAL DATA:  Abdominal pain beginning this morning. Nausea. Low back pain. EXAM: PORTABLE CHEST 1 VIEW COMPARISON:  None. FINDINGS: Patient slightly rotated to the left. Lungs are normally expanded without consolidation or effusion. Cardiomediastinal silhouette is within normal. Remaining bones and soft tissue structures are normal. IMPRESSION: No active disease. Electronically Signed   By: Marin Olp M.D.   On: 02/14/2017 18:09    Procedures Procedures (including critical care time)  Medications Ordered in ED Medications  sodium chloride 0.9 % bolus 1,000 mL (0 mLs Intravenous Stopped 02/14/17 2121)  fentaNYL (SUBLIMAZE) injection 50 mcg (50 mcg Intravenous Given 02/14/17 1914)    ondansetron (ZOFRAN) injection 4 mg (4 mg Intravenous Given 02/14/17 2015)  iopamidol (ISOVUE-300) 61 % injection 100 mL (100 mLs Intravenous Contrast Given 02/14/17 2027)  HYDROmorphone (DILAUDID) injection 1 mg (1 mg Intravenous Given 02/14/17 2134)     Initial Impression / Assessment and Plan / ED Course  I have reviewed the triage vital signs and the nursing notes.  Pertinent labs & imaging results that were available during my care of the patient were reviewed by me and considered in my medical decision making (see chart for details).  Clinical Course as of Feb 14 2357  Mon Feb 14, 2017  2010 Hypertension BP: (!) 92/50 [EW]    Clinical Course User Index [EW] Daleen Bo, MD     Patient Vitals for the past 24 hrs:  BP Temp Temp src Pulse Resp SpO2 Height Weight  02/14/17 2345 - - - (!) 51 18 99 % - -  02/14/17 2330 - - - (!) 46 (!) 9 99 % - -  02/14/17 2315 - - - (!) 44 (!) 9 99 % - -  02/14/17 2300 (!) 92/59 - - 60 16 100 % - -  02/14/17 2245 - - - Marland Kitchen)  46 12 99 % - -  02/14/17 2230 - - - (!) 52 12 99 % - -  02/14/17 2215 - - - (!) 47 15 99 % - -  02/14/17 2200 (!) 98/58 - - (!) 53 19 99 % - -  02/14/17 2145 - - - (!) 44 17 99 % - -  02/14/17 2130 - - - 72 (!) 21 100 % - -  02/14/17 2115 - - - - 16 - - -  02/14/17 2100 - - - 62 (!) 24 100 % - -  02/14/17 2045 - - - 61 16 98 % - -  02/14/17 2015 - - - (!) 52 12 100 % - -  02/14/17 2000 103/63 - - (!) 47 18 98 % - -  02/14/17 1945 - - - (!) 53 15 100 % - -  02/14/17 1930 - - - (!) 58 19 (!) 87 % - -  02/14/17 1915 - - - (!) 50 16 100 % - -  02/14/17 1900 (!) 95/57 - - (!) 52 17 100 % - -  02/14/17 1856 (!) 99/53 - - (!) 53 14 100 % - -  02/14/17 1845 - - - - 17 - - -  02/14/17 1830 - - - (!) 51 14 100 % - -  02/14/17 1815 - - - (!) 53 17 100 % - -  02/14/17 1800 - - - (!) 47 14 100 % - -  02/14/17 1745 - - - (!) 50 19 100 % - -  02/14/17 1730 (!) 78/62 - - (!) 56 20 100 % - -  02/14/17 1726 - - - - - - 5\' 7"  (1.702  m) 59 kg (130 lb)  02/14/17 1718 (!) 92/50 97.6 F (36.4 C) Oral (!) 52 15 100 % - -    7:56 PM Reevaluation with update and discussion. After initial assessment and treatment, an updated evaluation reveals she remains uncomfortable still with mid abdominal tenderness.  Patient's has been here and reports that she was complaining of low back and abdominal pain earlier this morning, prior to leaving, for work. CT abdomen pelvis ordered because of persistent abdominal pain.  No diarrhea in the emergency department. Dorris Pierre L   9:26 PM-at this time the patient is rocking in the bed, secondary to abdominal pain.  Updated findings discussed with patient and husband.  Patient denies recent vaginal discharge or bleeding.  She has some vaginal itching several days ago but it has resolved.  10:06 PM-she is more comfortable now after the last dose of narcotic analgesia.  Both she, and her family members, are uncomfortable with her being discharged at this time.  They understand that the patient has some minor CT abnormalities, but no clear diagnostic entity.  10:07 PM-Consult complete with hospitalist. Patient case explained and discussed.  She agrees to admit patient for further evaluation and treatment. Call ended at 22: 2 0  Final Clinical Impressions(s) / ED Diagnoses   Final diagnoses:  Nausea and vomiting, intractability of vomiting not specified, unspecified vomiting type  Abdominal pain, unspecified abdominal location  Hypotension, unspecified hypotension type    Abdominal pain, with nonspecific vomiting.  Possible etiologies include ruptured ovarian cyst, congestion syndrome, nonspecific enteritis.  Doubt that the patient's husband recently had a lower GI diarrheal illness.  Patient remains uncomfortable in the ED requiring multiple doses of narcotic analgesia.  She also has mild persistent hypotension  Nursing Notes Reviewed/ Care Coordinated Applicable Imaging Reviewed Interpretation  of Laboratory Data incorporated into ED treatment  Plan:-Hospitalize for observation.  New Prescriptions New Prescriptions   No medications on file     Daleen Bo, MD 02/14/17 2359

## 2017-02-14 NOTE — ED Notes (Signed)
Pt requesting update. Made MD aware.

## 2017-02-14 NOTE — ED Notes (Signed)
Pt sleeping comfortably.

## 2017-02-14 NOTE — ED Notes (Signed)
Bed: WA09 Expected date:  Expected time:  Means of arrival:  Comments: EMS 

## 2017-02-14 NOTE — ED Notes (Signed)
Hospitalist at bedside 

## 2017-02-15 DIAGNOSIS — K56609 Unspecified intestinal obstruction, unspecified as to partial versus complete obstruction: Secondary | ICD-10-CM | POA: Diagnosis not present

## 2017-02-15 DIAGNOSIS — R1110 Vomiting, unspecified: Secondary | ICD-10-CM | POA: Diagnosis not present

## 2017-02-15 DIAGNOSIS — R1084 Generalized abdominal pain: Secondary | ICD-10-CM | POA: Diagnosis not present

## 2017-02-15 DIAGNOSIS — R112 Nausea with vomiting, unspecified: Secondary | ICD-10-CM | POA: Diagnosis not present

## 2017-02-15 DIAGNOSIS — A084 Viral intestinal infection, unspecified: Secondary | ICD-10-CM | POA: Diagnosis not present

## 2017-02-15 DIAGNOSIS — Z9851 Tubal ligation status: Secondary | ICD-10-CM | POA: Diagnosis not present

## 2017-02-15 DIAGNOSIS — D696 Thrombocytopenia, unspecified: Secondary | ICD-10-CM | POA: Diagnosis not present

## 2017-02-15 DIAGNOSIS — M4306 Spondylolysis, lumbar region: Secondary | ICD-10-CM | POA: Diagnosis present

## 2017-02-15 DIAGNOSIS — K5900 Constipation, unspecified: Secondary | ICD-10-CM | POA: Diagnosis not present

## 2017-02-15 DIAGNOSIS — K529 Noninfective gastroenteritis and colitis, unspecified: Secondary | ICD-10-CM | POA: Diagnosis not present

## 2017-02-15 DIAGNOSIS — E876 Hypokalemia: Secondary | ICD-10-CM | POA: Diagnosis not present

## 2017-02-15 DIAGNOSIS — I959 Hypotension, unspecified: Secondary | ICD-10-CM | POA: Diagnosis not present

## 2017-02-15 DIAGNOSIS — D649 Anemia, unspecified: Secondary | ICD-10-CM | POA: Diagnosis present

## 2017-02-15 DIAGNOSIS — R110 Nausea: Secondary | ICD-10-CM | POA: Diagnosis not present

## 2017-02-15 DIAGNOSIS — N9489 Other specified conditions associated with female genital organs and menstrual cycle: Secondary | ICD-10-CM | POA: Diagnosis present

## 2017-02-15 DIAGNOSIS — Z4682 Encounter for fitting and adjustment of non-vascular catheter: Secondary | ICD-10-CM | POA: Diagnosis not present

## 2017-02-15 DIAGNOSIS — R109 Unspecified abdominal pain: Secondary | ICD-10-CM | POA: Diagnosis not present

## 2017-02-15 DIAGNOSIS — R001 Bradycardia, unspecified: Secondary | ICD-10-CM | POA: Diagnosis present

## 2017-02-15 DIAGNOSIS — Z0189 Encounter for other specified special examinations: Secondary | ICD-10-CM | POA: Diagnosis not present

## 2017-02-15 DIAGNOSIS — K56699 Other intestinal obstruction unspecified as to partial versus complete obstruction: Secondary | ICD-10-CM | POA: Diagnosis not present

## 2017-02-15 LAB — BASIC METABOLIC PANEL
Anion gap: 10 (ref 5–15)
BUN: 12 mg/dL (ref 6–20)
CO2: 24 mmol/L (ref 22–32)
Calcium: 8.1 mg/dL — ABNORMAL LOW (ref 8.9–10.3)
Chloride: 105 mmol/L (ref 101–111)
Creatinine, Ser: 0.88 mg/dL (ref 0.44–1.00)
GFR calc Af Amer: >60 mL/min (ref >60)
GFR calc non Af Amer: >60 mL/min (ref >60)
Glucose, Bld: 94 mg/dL (ref 65–99)
Potassium: 3.8 mmol/L (ref 3.5–5.1)
Sodium: 139 mmol/L (ref 135–145)

## 2017-02-15 LAB — CBC
HCT: 37.2 % (ref 36.0–46.0)
Hemoglobin: 12.3 g/dL (ref 12.0–15.0)
MCH: 31.9 pg (ref 26.0–34.0)
MCHC: 33.1 g/dL (ref 30.0–36.0)
MCV: 96.6 fL (ref 78.0–100.0)
Platelets: 150 10*3/uL (ref 150–400)
RBC: 3.85 MIL/uL — ABNORMAL LOW (ref 3.87–5.11)
RDW: 12.8 % (ref 11.5–15.5)
WBC: 5.7 10*3/uL (ref 4.0–10.5)

## 2017-02-15 LAB — HIV ANTIBODY (ROUTINE TESTING W REFLEX): HIV Screen 4th Generation wRfx: NONREACTIVE

## 2017-02-15 MED ORDER — ONDANSETRON HCL 4 MG PO TABS: 4.0000 mg | ORAL_TABLET | Freq: Four times a day (QID) | ORAL | Status: DC | PRN

## 2017-02-15 MED ORDER — SODIUM CHLORIDE 0.9 % IV BOLUS (SEPSIS)
1000.0000 mL | Freq: Once | INTRAVENOUS | Status: AC
  Administered 2017-02-15: 1000 mL via INTRAVENOUS

## 2017-02-15 MED ORDER — MORPHINE SULFATE (PF) 2 MG/ML IV SOLN
2.0000 mg | Freq: Once | INTRAVENOUS | Status: AC
  Administered 2017-02-15: 2 mg via INTRAVENOUS
  Filled 2017-02-15: qty 1

## 2017-02-15 MED ORDER — LACTATED RINGERS IV SOLN
INTRAVENOUS | Status: DC
  Administered 2017-02-15 (×2): via INTRAVENOUS

## 2017-02-15 MED ORDER — PANTOPRAZOLE SODIUM 40 MG PO TBEC
40.0000 mg | DELAYED_RELEASE_TABLET | Freq: Two times a day (BID) | ORAL | Status: DC
  Filled 2017-02-15: qty 1

## 2017-02-15 MED ORDER — ACETAMINOPHEN 325 MG PO TABS
650.0000 mg | ORAL_TABLET | Freq: Four times a day (QID) | ORAL | Status: DC | PRN
  Administered 2017-02-20: 650 mg via ORAL
  Filled 2017-02-15: qty 2

## 2017-02-15 MED ORDER — ONDANSETRON HCL 4 MG/2ML IJ SOLN
4.0000 mg | Freq: Four times a day (QID) | INTRAMUSCULAR | Status: DC | PRN
  Administered 2017-02-15 – 2017-02-18 (×6): 4 mg via INTRAVENOUS
  Filled 2017-02-15 (×6): qty 2

## 2017-02-15 MED ORDER — PANTOPRAZOLE SODIUM 40 MG IV SOLR
40.0000 mg | Freq: Once | INTRAVENOUS | Status: AC
  Administered 2017-02-15: 40 mg via INTRAVENOUS
  Filled 2017-02-15: qty 40

## 2017-02-15 MED ORDER — KETOROLAC TROMETHAMINE 30 MG/ML IJ SOLN
30.0000 mg | Freq: Four times a day (QID) | INTRAMUSCULAR | Status: DC | PRN
  Administered 2017-02-15 – 2017-02-17 (×10): 30 mg via INTRAVENOUS
  Filled 2017-02-15 (×11): qty 1

## 2017-02-15 MED ORDER — ACETAMINOPHEN 650 MG RE SUPP: 650.0000 mg | Freq: Four times a day (QID) | RECTAL | Status: DC | PRN

## 2017-02-15 MED ORDER — SODIUM CHLORIDE 0.9 % IV SOLN
INTRAVENOUS | Status: DC
  Administered 2017-02-15 – 2017-02-16 (×3): via INTRAVENOUS
  Administered 2017-02-17: 1000 mL via INTRAVENOUS
  Administered 2017-02-17 – 2017-02-20 (×6): via INTRAVENOUS

## 2017-02-15 MED ORDER — METOCLOPRAMIDE HCL 5 MG/ML IJ SOLN
5.0000 mg | Freq: Three times a day (TID) | INTRAMUSCULAR | Status: DC
  Administered 2017-02-15 – 2017-02-20 (×17): 5 mg via INTRAVENOUS
  Filled 2017-02-15 (×17): qty 2

## 2017-02-15 MED ORDER — POLYETHYLENE GLYCOL 3350 17 G PO PACK
17.0000 g | PACK | Freq: Every day | ORAL | Status: DC
  Filled 2017-02-15: qty 1

## 2017-02-15 NOTE — Progress Notes (Signed)
[facility] Progress Note  Patient: Gina Fisher AQT:[id number]   PCP: [name], MD DOB: May 03, 1966   DOA: 02/14/2017   DOS: 02/15/2017   Date of Service: the patient was seen and examined on 02/15/2017  Subjective: Patient was feeling better earlier in the morning. Diet advance to soft diet. After eating lunch with clear liquid, patient started having reoccurrence of vomiting, 4 episodes as well as reoccurrence of abdominal pain requiring IV morphine. No BM.  Brief hospital course: Pt. with no significant PMH; admitted on 02/14/2017, presented with complaint of nausea and vomiting as well as abdominal pain, was found to have severe viral gastroenteritis. Currently further plan is supportive care.  Assessment and Plan: 1. Severe viral gastroenteritis. Intractable nausea and vomiting. Patient presented with complaints of abdominal pain as well as nausea and vomiting. Has not been having BM, not passing gas since this morning. Renal function, liver function, CT abdomen pelvis, lipase, lactic acid all negative or unremarkable. At present no other acute etiology identified. Continue supportive care. IV fluids. Clear liquid diet. IV Protonix. Scheduled Reglan. Schedule MiraLAX. No evidence of hypokalemia or acute kidney injury on lab work but actively vomiting in the hospital.  2. Sinus bradycardia. EKG shows marked sinus bradycardia. No symptoms of dizziness or lightheadedness or syncope reported. Continue daily QTC monitoring.  3. Hypotension. Patient mentions that her blood pressure generally runs on a lower side. Orthostatic vitals are negative patient also remains asymptomatic. We'll continue to monitor. Continue IV hydration.  4. Back pain. Patient has history of endometrial ablation. Does not have periods. Does not really come consistent with her menstrual cramps.  Diet: Clear liquid diet DVT Prophylaxis: subcutaneous Heparin  Advance goals of care discussion: Full  code  Family Communication: no family was present at bedside, at the time of interview.  Disposition:  Discharge to home.  Consultants: none Procedures: none  Antibiotics: Anti-infectives    None       Objective: Physical Exam: Vitals:   02/15/17 0448 02/15/17 1002 02/15/17 1328 02/15/17 1329  BP: (!) 98/43 (!) 95/30 (!) 96/36 (!) 97/31  Pulse: (!) 50 (!) 43 (!) 50 (!) 44  Resp: 15 16 16 16   Temp: 99.1 F (37.3 C) 99.5 F (37.5 C) 98.5 F (36.9 C)   TempSrc: Oral Oral Axillary   SpO2: 100%     Weight:      Height:        Intake/Output Summary (Last 24 hours) at 02/15/17 1805 Last data filed at 02/15/17 1500  Gross per 24 hour  Intake          2528.34 ml  Output              800 ml  Net          1728.34 ml   Filed Weights   02/14/17 1726  Weight: 59 kg (130 lb)   General: Alert, Awake and Oriented to Time, Place and Person. Appear in mild distress, affect appropriate Eyes: PERRL, Conjunctiva normal ENT: Oral Mucosa clear moist. Neck: no JVD, no Abnormal Mass Or lumps Cardiovascular: S1 and S2 Present, no Murmur, Peripheral Pulses Present Respiratory: normal respiratory effort, Bilateral Air entry equal and Decreased, no use of accessory muscle, Clear to Auscultation, no Crackles, no wheezes Abdomen: Bowel Sound present, Soft and no tenderness, no hernia Skin: no redness, no Rash, no induration Extremities: no Pedal edema, no calf tenderness Neurologic: Grossly no focal neuro deficit. Bilaterally Equal motor strength  Data Reviewed: CBC:  Recent Labs Lab 02/14/17 1729 02/15/17 0449  WBC 8.1 5.7  HGB 12.9 12.3  HCT 36.8 37.2  MCV 92.9 96.6  PLT 156 641   Basic Metabolic Panel:  Recent Labs Lab 02/14/17 1824 02/15/17 0449  NA 140 139  K 4.0 3.8  CL 108 105  CO2 21* 24  GLUCOSE 95 94  BUN 14 12  CREATININE 0.87 0.88  CALCIUM 8.8* 8.1*    Liver Function Tests:  Recent Labs Lab 02/14/17 1824  AST 22  ALT 13*  ALKPHOS 41  BILITOT 1.3*   PROT 7.3  ALBUMIN 4.5    Recent Labs Lab 02/14/17 1824  LIPASE 29   No results for input(s): AMMONIA in the last 168 hours. Coagulation Profile: No results for input(s): INR, PROTIME in the last 168 hours. Cardiac Enzymes: No results for input(s): CKTOTAL, CKMB, CKMBINDEX, TROPONINI in the last 168 hours. BNP (last 3 results) No results for input(s): PROBNP in the last 8760 hours. CBG: No results for input(s): GLUCAP in the last 168 hours. Studies: Ct Abdomen Pelvis W Contrast  Result Date: 02/14/2017 CLINICAL DATA:  Mid to lower abdominal pain with nausea and vomiting 1-2 days. EXAM: CT ABDOMEN AND PELVIS WITH CONTRAST TECHNIQUE: Multidetector CT imaging of the abdomen and pelvis was performed using the standard protocol following bolus administration of intravenous contrast. CONTRAST:  132mL ISOVUE-300 IOPAMIDOL (ISOVUE-300) INJECTION 61% COMPARISON:  None. FINDINGS: Lower chest: Lung bases are normal. Hepatobiliary: Subcentimeter hypodensity over the right lobe of the liver too small to characterize but likely a cyst. Gallbladder and biliary tree are within normal. Pancreas: Within normal. Spleen: Within normal. Adrenals/Urinary Tract: Adrenal glands are normal. Kidneys are normal size without hydronephrosis or nephrolithiasis. Ureters and bladder are normal. Stomach/Bowel: Stomach is within normal. There are multiple fluid-filled prominent but nondilated mid to distal small bowel loops. Appendix is difficult to visualize but likely within normal. Colon is unremarkable. Vascular/Lymphatic: Arterial structures are within normal. There are prominent pelvic/periuterine veins and prominent left ovarian/gonadal vein. No evidence of adenopathy. Reproductive: Uterus and ovaries are unremarkable. There is mild to moderate free fluid in the pelvis which is simple in nature. Other: No evidence of focal inflammatory change or free peritoneal air. Musculoskeletal: Degenerative change with disc disease  at the L5-S1 level. Grade 2 anterolisthesis of L5 on S1 due to bilateral L5 spondylolysis. IMPRESSION: No acute findings in the abdomen/ pelvis. Mild to moderate simple free fluid in the pelvis. Prominent pelvic/ periuterine veins and left ovarian vein which can be seen with pelvic congestion syndrome. Subcentimeter right liver hypodensity too small to characterize but likely a cyst. Grade 2 anterolisthesis of L5 on S1 due to bilateral L5 spondylolysis. Electronically Signed   By: Marin Olp M.D.   On: 02/14/2017 20:57    Scheduled Meds: . metoCLOPramide (REGLAN) injection  5 mg Intravenous Q8H  . [START ON 02/16/2017] pantoprazole  40 mg Oral BID AC  . polyethylene glycol  17 g Oral Daily   Continuous Infusions: . sodium chloride 100 mL/hr at 02/15/17 1453   PRN Meds: acetaminophen **OR** acetaminophen, ketorolac, ondansetron **OR** ondansetron (ZOFRAN) IV  Time spent: 40 minutes  Author: Berle Mull, MD [facility] Pager: [phone] 02/15/2017 6:05 PM  If 7PM-7AM, please contact night-coverage at [email or web address], password [facility]

## 2017-02-15 NOTE — Progress Notes (Signed)
Walked patient in the hall, per MD request. Before walking, patient's vitals were 96/36, 50 pulse. After walking BP was 97/31 with a pulse of 44.   Patient also ate some broth and jello for lunch, at nurse's request. This was the first time today she has attempted to eat. She couldn't finished the broth or jello before becoming nauseous. Will notify MD of changes and continue to monitor.

## 2017-02-15 NOTE — Progress Notes (Signed)
Discharge planning, no HH needs identified. [phone] 

## 2017-02-16 ENCOUNTER — Inpatient Hospital Stay (HOSPITAL_COMMUNITY): Payer: 59

## 2017-02-16 DIAGNOSIS — R109 Unspecified abdominal pain: Secondary | ICD-10-CM

## 2017-02-16 DIAGNOSIS — K529 Noninfective gastroenteritis and colitis, unspecified: Secondary | ICD-10-CM

## 2017-02-16 DIAGNOSIS — K56609 Unspecified intestinal obstruction, unspecified as to partial versus complete obstruction: Secondary | ICD-10-CM

## 2017-02-16 DIAGNOSIS — R001 Bradycardia, unspecified: Secondary | ICD-10-CM

## 2017-02-16 DIAGNOSIS — R112 Nausea with vomiting, unspecified: Secondary | ICD-10-CM

## 2017-02-16 DIAGNOSIS — I959 Hypotension, unspecified: Secondary | ICD-10-CM

## 2017-02-16 LAB — CBC WITH DIFFERENTIAL/PLATELET
Basophils Absolute: 0 10*3/uL (ref 0.0–0.1)
Basophils Relative: 0 %
EOS ABS: 0 10*3/uL (ref 0.0–0.7)
Eosinophils Relative: 0 %
HCT: 31.2 % — ABNORMAL LOW (ref 36.0–46.0)
HEMOGLOBIN: 10.3 g/dL — AB (ref 12.0–15.0)
LYMPHS ABS: 0.8 10*3/uL (ref 0.7–4.0)
LYMPHS PCT: 21 %
MCH: 31.6 pg (ref 26.0–34.0)
MCHC: 33 g/dL (ref 30.0–36.0)
MCV: 95.7 fL (ref 78.0–100.0)
MONOS PCT: 7 %
Monocytes Absolute: 0.3 10*3/uL (ref 0.1–1.0)
NEUTROS PCT: 71 %
Neutro Abs: 2.7 10*3/uL (ref 1.7–7.7)
Platelets: 116 10*3/uL — ABNORMAL LOW (ref 150–400)
RBC: 3.26 MIL/uL — AB (ref 3.87–5.11)
RDW: 12.7 % (ref 11.5–15.5)
WBC: 3.7 10*3/uL — AB (ref 4.0–10.5)

## 2017-02-16 LAB — COMPREHENSIVE METABOLIC PANEL
ALT: 12 U/L — AB (ref 14–54)
AST: 18 U/L (ref 15–41)
Albumin: 3.3 g/dL — ABNORMAL LOW (ref 3.5–5.0)
Alkaline Phosphatase: 34 U/L — ABNORMAL LOW (ref 38–126)
Anion gap: 7 (ref 5–15)
BUN: 12 mg/dL (ref 6–20)
CHLORIDE: 111 mmol/L (ref 101–111)
CO2: 21 mmol/L — AB (ref 22–32)
CREATININE: 0.83 mg/dL (ref 0.44–1.00)
Calcium: 7.9 mg/dL — ABNORMAL LOW (ref 8.9–10.3)
GFR calc non Af Amer: >60 mL/min (ref >60)
Glucose, Bld: 85 mg/dL (ref 65–99)
Potassium: 3.7 mmol/L (ref 3.5–5.1)
SODIUM: 139 mmol/L (ref 135–145)
Total Bilirubin: 0.7 mg/dL (ref 0.3–1.2)
Total Protein: 5.6 g/dL — ABNORMAL LOW (ref 6.5–8.1)

## 2017-02-16 LAB — TSH: TSH: 1.719 u[IU]/mL (ref 0.350–4.500)

## 2017-02-16 LAB — LIPASE, BLOOD: Lipase: 22 U/L (ref 11–51)

## 2017-02-16 LAB — MAGNESIUM: MAGNESIUM: 1.9 mg/dL (ref 1.7–2.4)

## 2017-02-16 LAB — LACTIC ACID, PLASMA: Lactic Acid, Venous: 0.8 mmol/L (ref 0.5–1.9)

## 2017-02-16 IMAGING — DX DG ABDOMEN 2V
2 series · 2 of 2 positions shown · non-contrast
Comparison: CT scan the abdomen and pelvis of [DATE]

CLINICAL DATA: Lower abdominal pain with nausea and vomiting and
constipation.

EXAM:
ABDOMEN - 2 VIEW

[abdomen erect]
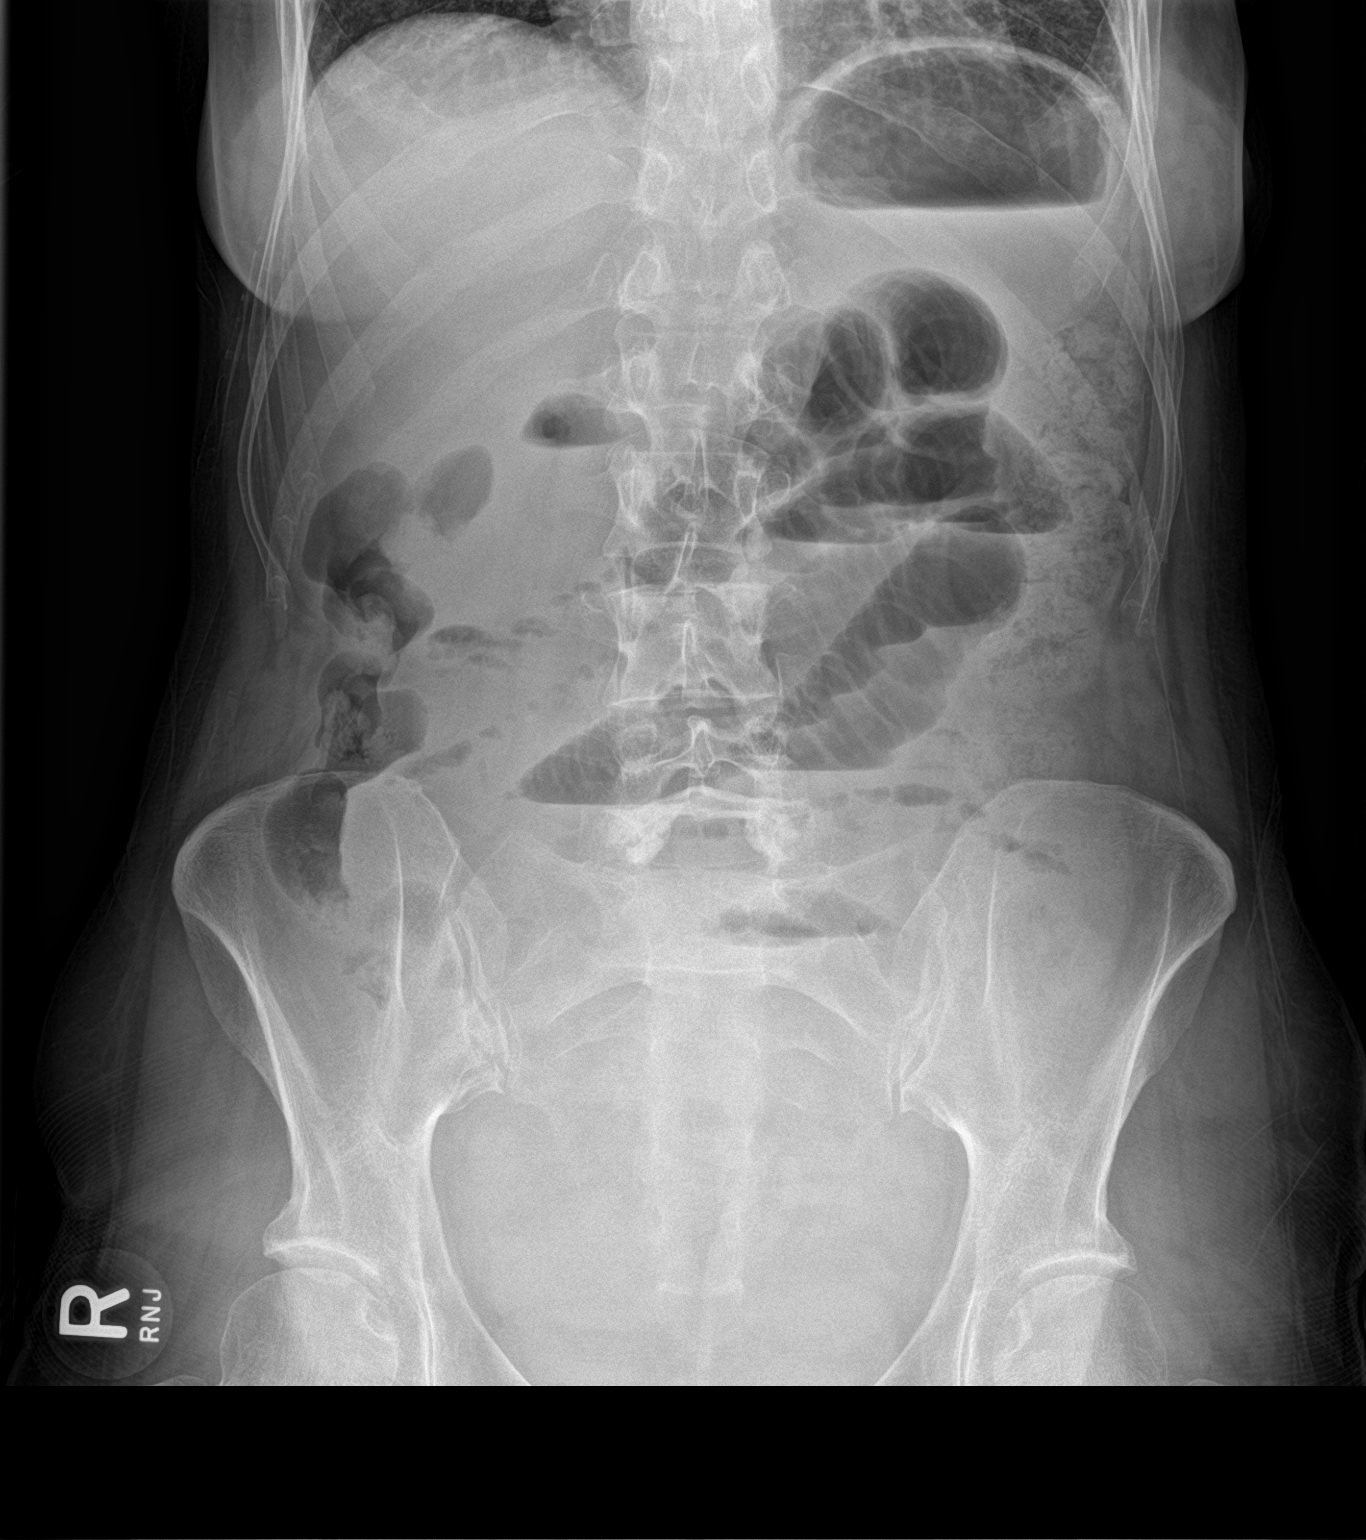

[abdomen supine]
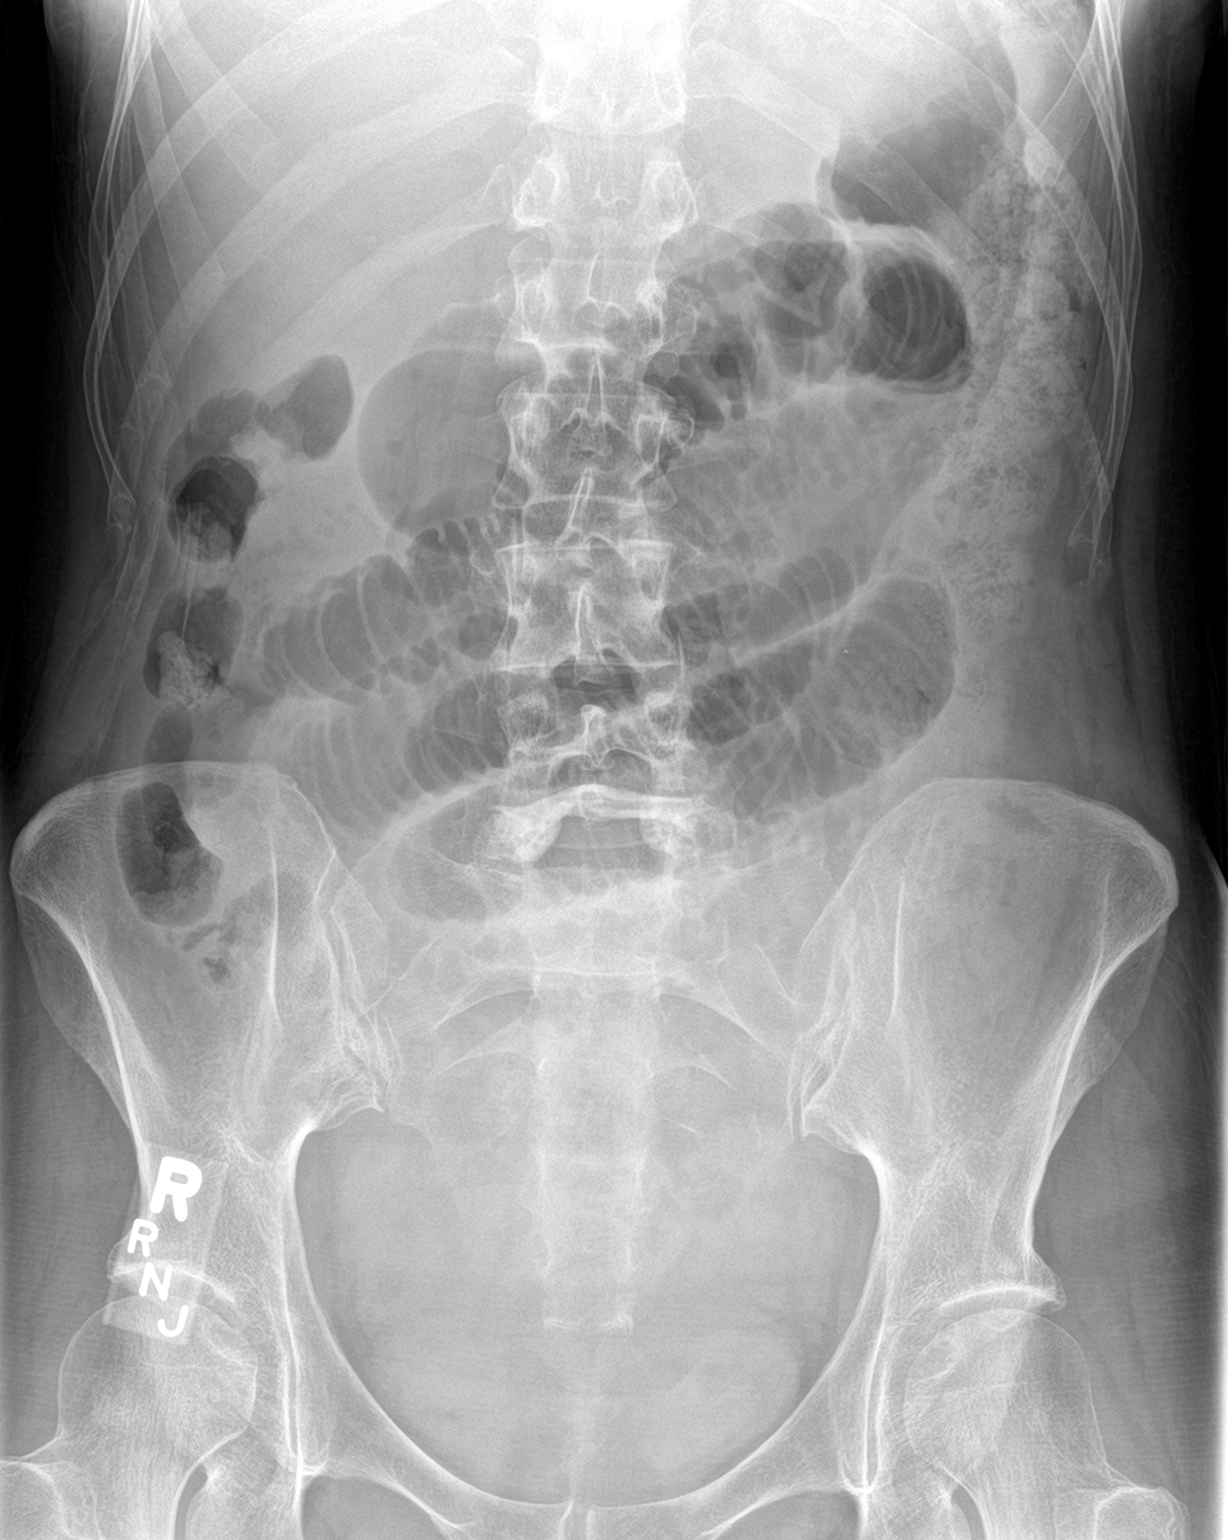

[2 of 2 positions shown; findings below may reference images not displayed]

FINDINGS: There are multiple air in fluid filled distended small bowel loops
in the mid abdomen. There is stool in the left colon and some gas in
the right colon. There is gas and fluid within the stomach. There is
no gas or stool in the rectum. There are no abnormal soft tissue
calcifications. There degenerative changes of the lower lumbar
spine.
IMPRESSION: Findings compatible with a mid to distal small bowel obstruction.
There is no evidence of perforation.

## 2017-02-16 MED ORDER — MORPHINE SULFATE (PF) 2 MG/ML IV SOLN
2.0000 mg | Freq: Once | INTRAVENOUS | Status: AC
  Administered 2017-02-16: 2 mg via INTRAVENOUS
  Filled 2017-02-16: qty 1

## 2017-02-16 MED ORDER — PANTOPRAZOLE SODIUM 40 MG IV SOLR
40.0000 mg | INTRAVENOUS | Status: DC
  Administered 2017-02-18 – 2017-02-20 (×3): 40 mg via INTRAVENOUS
  Filled 2017-02-16 (×5): qty 40

## 2017-02-16 MED ORDER — SODIUM CHLORIDE 0.9 % IV BOLUS (SEPSIS)
500.0000 mL | Freq: Once | INTRAVENOUS | Status: AC
  Administered 2017-02-16: 500 mL via INTRAVENOUS

## 2017-02-16 NOTE — Consult Note (Signed)
Reason for Consult:  Possible SBO Referring Physician: Nunzio Cory  Gina Fisher is an 51 y.o. female.   HPI: Pt presented to the ED on 02/14/17 with abdominal pain around the umbilicus and several episodes on emesis.   Her husband had had some type of gastroenteritis over the weekend that started Friday 02/11/17.  His symptoms are primarily diarrhea, without N/V.  He is still at home with diarrhea. Her symptoms are nausea and emesis.  She has not vomited since yesterday, but remains distended with No BM since 02/14/17.    On exam in the ED BS were hypoactive.  She was afebrile, a bit hypotensive, without tachycardia.  No significant lab abnormalities.  CXR OK, CT abd/pelvis 02/14/17:No acute findings in the abdomen/ pelvis.  Mild to moderate simple free fluid in the pelvis.  Prominent pelvic/ periuterine veins and left ovarian vein which can be seen with pelvic congestion syndrome.  Subcentimeter right liver hypodensity too small to characterize but likely a cyst.  Grade 2 anterolisthesis of L5 on S1 due to bilateral L5  spondylolysis. 2 view abdominal Film today shows:  Findings compatible with a mid to distal small bowel obstruction. There is no evidence of perforation. Labs today show WBC down to 3.7, LFT's are normal, no acute finding.  We are ask to see    History reviewed. No pertinent past medical history.  Past Surgical History:  Procedure Laterality Date  . ENDOMETRIAL ABLATION    . TUBAL LIGATION - laparoscopic  About 17 years ago      History reviewed. No pertinent family history.  Social History:  reports that she has never smoked. She has never used smokeless tobacco. She reports that she drinks alcohol. She reports that she does not use drugs. 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   Scheduled: . metoCLOPramide  (REGLAN) injection  5 mg Intravenous Q8H  . pantoprazole (PROTONIX) IV  40 mg Intravenous Q24H   Continuous: . sodium chloride 100 mL/hr at 02/16/17 1013   FUN:[id number] **OR** acetaminophen, ketorolac, ondansetron **OR** ondansetron (ZOFRAN) IV Anti-infectives    None      Results for orders placed or performed during the hospital encounter of 02/14/17 (from the past 48 hour(s))  CBC     Status: None   Collection Time: 02/14/17  5:29 PM  Result Value Ref Range   WBC 8.1 4.0 - 10.5 K/uL   RBC 3.96 3.87 - 5.11 MIL/uL   Hemoglobin 12.9 12.0 - 15.0 g/dL   HCT 25.5 14.8 - 63.7 %   MCV 92.9 78.0 - 100.0 fL   MCH 32.6 26.0 - 34.0 pg   MCHC 35.1 30.0 - 36.0 g/dL   RDW 31.5 35.5 - 66.5 %   Platelets 156 150 - 400 K/uL  I-Stat beta hCG blood, ED     Status: None   Collection Time: 02/14/17  5:37 PM  Result Value Ref Range   I-stat hCG, quantitative <5.0 <5 mIU/mL   Comment 3            Comment:   GEST. AGE      CONC.  (mIU/mL)   <=1 WEEK        5 - 50     2 WEEKS       50 - 500     3  WEEKS       100 - 10,000     4 WEEKS     1,000 - 30,000        FEMALE AND NON-PREGNANT FEMALE:     LESS THAN 5 mIU/mL   I-Stat CG4 Lactic Acid, ED     Status: None   Collection Time: 02/14/17  5:45 PM  Result Value Ref Range   Lactic Acid, Venous 1.87 0.5 - 1.9 mmol/L  Lipase, blood     Status: None   Collection Time: 02/14/17  6:24 PM  Result Value Ref Range   Lipase 29 11 - 51 U/L  Comprehensive metabolic panel     Status: Abnormal   Collection Time: 02/14/17  6:24 PM  Result Value Ref Range   Sodium 140 135 - 145 mmol/L   Potassium 4.0 3.5 - 5.1 mmol/L   Chloride 108 101 - 111 mmol/L   CO2 21 (L) 22 - 32 mmol/L   Glucose, Bld 95 65 - 99 mg/dL   BUN 14 6 - 20 mg/dL   Creatinine, Ser 0.87 0.44 - 1.00 mg/dL   Calcium 8.8 (L) 8.9 - 10.3 mg/dL   Total Protein 7.3 6.5 - 8.1 g/dL   Albumin 4.5 3.5 - 5.0 g/dL   AST 22 15 - 41 U/L   ALT 13 (L) 14 - 54 U/L   Alkaline Phosphatase 41 38 -  126 U/L   Total Bilirubin 1.3 (H) 0.3 - 1.2 mg/dL   GFR calc non Af Amer >60 >60 mL/min   GFR calc Af Amer >60 >60 mL/min    Comment: (NOTE) The eGFR has been calculated using the CKD EPI equation. This calculation has not been validated in all clinical situations. eGFR's persistently <60 mL/min signify possible Chronic Kidney Disease.    Anion gap 11 5 - 15  HIV antibody (Routine Testing)     Status: None   Collection Time: 02/15/17  4:49 AM  Result Value Ref Range   HIV Screen 4th Generation wRfx Non Reactive Non Reactive    Comment: (NOTE) Performed At: [facility] [place], [place] [id number] Lindon Romp MD WU:[id number]   Basic metabolic panel     Status: Abnormal   Collection Time: 02/15/17  4:49 AM  Result Value Ref Range   Sodium 139 135 - 145 mmol/L   Potassium 3.8 3.5 - 5.1 mmol/L   Chloride 105 101 - 111 mmol/L   CO2 24 22 - 32 mmol/L   Glucose, Bld 94 65 - 99 mg/dL   BUN 12 6 - 20 mg/dL   Creatinine, Ser 0.88 0.44 - 1.00 mg/dL   Calcium 8.1 (L) 8.9 - 10.3 mg/dL   GFR calc non Af Amer >60 >60 mL/min   GFR calc Af Amer >60 >60 mL/min    Comment: (NOTE) The eGFR has been calculated using the CKD EPI equation. This calculation has not been validated in all clinical situations. eGFR's persistently <60 mL/min signify possible Chronic Kidney Disease.    Anion gap 10 5 - 15  CBC     Status: Abnormal   Collection Time: 02/15/17  4:49 AM  Result Value Ref Range   WBC 5.7 4.0 - 10.5 K/uL   RBC 3.85 (L) 3.87 - 5.11 MIL/uL   Hemoglobin 12.3 12.0 - 15.0 g/dL   HCT 37.2 36.0 - 46.0 %   MCV 96.6 78.0 - 100.0 fL   MCH 31.9 26.0 - 34.0 pg   MCHC 33.1 30.0 -  36.0 g/dL   RDW 36.6 00.6 - 59.0 %   Platelets 150 150 - 400 K/uL  Comprehensive metabolic panel     Status: Abnormal   Collection Time: 02/16/17  4:17 AM  Result Value Ref Range   Sodium 139 135 - 145 mmol/L   Potassium 3.7 3.5 - 5.1 mmol/L   Chloride 111 101 - 111 mmol/L   CO2 21  (L) 22 - 32 mmol/L   Glucose, Bld 85 65 - 99 mg/dL   BUN 12 6 - 20 mg/dL   Creatinine, Ser 1.91 0.44 - 1.00 mg/dL   Calcium 7.9 (L) 8.9 - 10.3 mg/dL   Total Protein 5.6 (L) 6.5 - 8.1 g/dL   Albumin 3.3 (L) 3.5 - 5.0 g/dL   AST 18 15 - 41 U/L   ALT 12 (L) 14 - 54 U/L   Alkaline Phosphatase 34 (L) 38 - 126 U/L   Total Bilirubin 0.7 0.3 - 1.2 mg/dL   GFR calc non Af Amer >60 >60 mL/min   GFR calc Af Amer >60 >60 mL/min    Comment: (NOTE) The eGFR has been calculated using the CKD EPI equation. This calculation has not been validated in all clinical situations. eGFR's persistently <60 mL/min signify possible Chronic Kidney Disease.    Anion gap 7 5 - 15  CBC with Differential/Platelet     Status: Abnormal   Collection Time: 02/16/17  4:17 AM  Result Value Ref Range   WBC 3.7 (L) 4.0 - 10.5 K/uL   RBC 3.26 (L) 3.87 - 5.11 MIL/uL   Hemoglobin 10.3 (L) 12.0 - 15.0 g/dL   HCT 70.1 (L) 52.6 - 67.3 %   MCV 95.7 78.0 - 100.0 fL   MCH 31.6 26.0 - 34.0 pg   MCHC 33.0 30.0 - 36.0 g/dL   RDW 38.8 32.6 - 23.5 %   Platelets 116 (L) 150 - 400 K/uL    Comment: REPEATED TO VERIFY SPECIMEN CHECKED FOR CLOTS PLATELET COUNT CONFIRMED BY SMEAR    Neutrophils Relative % 71 %   Neutro Abs 2.7 1.7 - 7.7 K/uL   Lymphocytes Relative 21 %   Lymphs Abs 0.8 0.7 - 4.0 K/uL   Monocytes Relative 7 %   Monocytes Absolute 0.3 0.1 - 1.0 K/uL   Eosinophils Relative 0 %   Eosinophils Absolute 0.0 0.0 - 0.7 K/uL   Basophils Relative 0 %   Basophils Absolute 0.0 0.0 - 0.1 K/uL  Magnesium     Status: None   Collection Time: 02/16/17  4:17 AM  Result Value Ref Range   Magnesium 1.9 1.7 - 2.4 mg/dL  Lactic acid, plasma     Status: None   Collection Time: 02/16/17  4:17 AM  Result Value Ref Range   Lactic Acid, Venous 0.8 0.5 - 1.9 mmol/L  Lipase, blood     Status: None   Collection Time: 02/16/17  4:17 AM  Result Value Ref Range   Lipase 22 11 - 51 U/L    Ct Abdomen Pelvis W Contrast  Result Date:  02/14/2017 CLINICAL DATA:  Mid to lower abdominal pain with nausea and vomiting 1-2 days. EXAM: CT ABDOMEN AND PELVIS WITH CONTRAST TECHNIQUE: Multidetector CT imaging of the abdomen and pelvis was performed using the standard protocol following bolus administration of intravenous contrast. CONTRAST:  ISOVUE-300 IOPAMIDOL (ISOVUE-300) INJECTION 61% COMPARISON:  None. FINDINGS: Lower chest: Lung bases are normal. Hepatobiliary: Subcentimeter hypodensity over the right lobe of the liver too small to characterize but  likely a cyst. Gallbladder and biliary tree are within normal. Pancreas: Within normal. Spleen: Within normal. Adrenals/Urinary Tract: Adrenal glands are normal. Kidneys are normal size without hydronephrosis or nephrolithiasis. Ureters and bladder are normal. Stomach/Bowel: Stomach is within normal. There are multiple fluid-filled prominent but nondilated mid to distal small bowel loops. Appendix is difficult to visualize but likely within normal. Colon is unremarkable. Vascular/Lymphatic: Arterial structures are within normal. There are prominent pelvic/periuterine veins and prominent left ovarian/gonadal vein. No evidence of adenopathy. Reproductive: Uterus and ovaries are unremarkable. There is mild to moderate free fluid in the pelvis which is simple in nature. Other: No evidence of focal inflammatory change or free peritoneal air. Musculoskeletal: Degenerative change with disc disease at the L5-S1 level. Grade 2 anterolisthesis of L5 on S1 due to bilateral L5 spondylolysis. IMPRESSION: No acute findings in the abdomen/ pelvis. Mild to moderate simple free fluid in the pelvis. Prominent pelvic/ periuterine veins and left ovarian vein which can be seen with pelvic congestion syndrome. Subcentimeter right liver hypodensity too small to characterize but likely a cyst. Grade 2 anterolisthesis of L5 on S1 due to bilateral L5 spondylolysis. Electronically Signed   By: Marin Olp M.D.   On:  02/14/2017 20:57   Dg Chest Port 1 View  Result Date: 02/14/2017 CLINICAL DATA:  Abdominal pain beginning this morning. Nausea. Low back pain. EXAM: PORTABLE CHEST 1 VIEW COMPARISON:  None. FINDINGS: Patient slightly rotated to the left. Lungs are normally expanded without consolidation or effusion. Cardiomediastinal silhouette is within normal. Remaining bones and soft tissue structures are normal. IMPRESSION: No active disease. Electronically Signed   By: Marin Olp M.D.   On: 02/14/2017 18:09   Dg Abd 2 Views  Result Date: 02/16/2017 CLINICAL DATA:  Lower abdominal pain with nausea and vomiting and constipation. EXAM: ABDOMEN - 2 VIEW COMPARISON:  CT scan the abdomen and pelvis of February 14, 2017 FINDINGS: There are multiple air in fluid filled distended small bowel loops in the mid abdomen. There is stool in the left colon and some gas in the right colon. There is gas and fluid within the stomach. There is no gas or stool in the rectum. There are no abnormal soft tissue calcifications. There degenerative changes of the lower lumbar spine. IMPRESSION: Findings compatible with a mid to distal small bowel obstruction. There is no evidence of perforation. Electronically Signed   By: David  Martinique M.D.   On: 02/16/2017 08:36    Review of Systems  Constitutional: Negative.   HENT: Negative.   Eyes: Negative.   Respiratory: Negative.   Cardiovascular: Negative.   Gastrointestinal: Positive for abdominal pain and constipation (she has BM q2-3 days normally, last one was 2 days ago). Negative for blood in stool, diarrhea, heartburn, melena, nausea and vomiting.  Genitourinary: Negative.   Musculoskeletal: Negative.   Skin: Negative.   Neurological: Negative.   Endo/Heme/Allergies: Negative.   Psychiatric/Behavioral: Negative.    Blood pressure (!) 86/62, pulse (!) 45, temperature 98.8 F (37.1 C), temperature source Oral, resp. rate 16, height '5\' 7"'$  (1.702 m), weight 59 kg (130 lb), SpO2 100  %. Physical Exam  Constitutional: She is oriented to person, place, and time. She appears well-developed and well-nourished.  HENT:  Head: Normocephalic and atraumatic.  Mouth/Throat: Oropharyngeal exudate present.  Eyes: Right eye exhibits no discharge. Left eye exhibits no discharge. No scleral icterus.  Pupils are equal  Neck: Normal range of motion. Neck supple. No JVD present. No tracheal deviation present. No thyromegaly  present.  Cardiovascular: Normal rate, regular rhythm, normal heart sounds and intact distal pulses.   No murmur heard. Respiratory: Effort normal and breath sounds normal. No respiratory distress. She has no wheezes. She has no rales. She exhibits no tenderness.  GI: Soft. She exhibits distension (minimal distension). She exhibits no mass. There is tenderness (minimal ). There is no rebound and no guarding.  Hyperactive BS  Musculoskeletal: She exhibits no edema, tenderness or deformity.  Lymphadenopathy:    She has no cervical adenopathy.  Neurological: She is alert and oriented to person, place, and time. No cranial nerve deficit.  Skin: Skin is warm and dry. No rash noted. No erythema. No pallor.  Psychiatric: She has a normal mood and affect. Her behavior is normal. Judgment and thought content normal.   . sodium chloride 100 mL/hr at 02/16/17 1013    Assessment/Plan: Gastroenteritis vs ileus -  Hx of tubal ligation  Plan:  I would get her up walking some.  If she has further emesis I would place and NG/LIWS. Continue IV fluids, recheck labs and films in AM.  Hopefully this is something she got along with her husband this weekend, and not really a surgical issue. She has not had a BM since Monday so no concerns about C Diff.  GI panel could not be done currently here.   She has no prior issue with SBO and is really very healthy normally.    Irineo Gaulin 02/16/2017, 1:37 PM

## 2017-02-16 NOTE — Progress Notes (Signed)
PROGRESS NOTE    Gina Fisher  RUE:[id number] DOB: 03-Jan-1966 DOA: 02/14/2017 PCP: [name], MD   Chief Complaint  Patient presents with  . Abdominal Pain  . Emesis    Brief Narrative:  HPI On 02/14/2017 by Dr. Karmen Bongo Gina Fisher is a 51 y.o. female with no significant medical history presenting with apparent viral gastroenteritis.  Husband had a stomach virus over the weekend; they stayed in a hotel room together.  She awoke this AM with mild abdominal pain and nausea. Went to work and about 930 developed more severe abdominal pain and n/v.  Took Zofran and went to sleep for a few hours.  Awoke and had more n/v with severe abdominal pain.  "It just wouldn't go away."  Pain started radiating into her lower back and she just couldn't get relief.  She finally told her husband she had to come to the ER, called 911.  They drove back from [place] yesterday and she felt well at that time. Assessment & Plan   Intractable nausea, severe viral gastroenteritis -CT and pelvis showed no acute findings, mid to moderate simple free fluid in the pelvis -Continue conservative management -Antiemetics, IV fluids  ? Small bowel obstruction versus ileus -Abdominal x-ray today shows mid to distal small bowel obstruction, no evidence of perforation -She has not had a bowel movement since Monday, 02/14/2017 -Continues to have nausea -Will make patient NPO -Discussed NG tube placement with the patient, if nausea continues, will place NG tube -Gen. surgery consulted and appreciated -Not sure if this is truly small bowel obstruction given CT scan on 02/14/2017 was unremarkable  Sinus bradycardia -Currently asymptomatic, continue to monitor on telemetry -will obtain TSH  Hypotension -Per patient, blood pressure generally runs on the lower side. -Orthostatic vital signs negative, patient remains asymptomatic -Continue IV hydration  Back pain -Continue pain control -CTabd pelvis shows grade 2  anterior listhesis of L5 on S1 due to bilateral L5 spondylolysis  ? Pelvic congestion syndrome -CT abdomen and pelvis showed prominent pelvic/uterine veins and left ovarian vein -Will need outpatient follow-up with her OB/GYN on discharge  DVT Prophylaxis  SCDs  Code Status: Full  Family Communication: None at  bedside  Disposition Plan: Admitted. Home when stable.  Consultants Gen. surgery  Procedures  None  Antibiotics   Anti-infectives    None      Subjective:   Gina Fisher seen and examined today.  Continues to have abdominal pain, nausea and some vomiting. Unable to tolerate much via oral intake. Currently denies chest pain, shortness of breath, dizziness or headache.  Objective:   Vitals:   02/15/17 1329 02/15/17 2208 02/16/17 0506 02/16/17 1306  BP: (!) 97/31 (!) 95/34 (!) 100/43 (!) 86/62  Pulse: (!) 44 (!) 47 (!) 56 (!) 45  Resp: 16 16 14 16   Temp:  99.1 F (37.3 C) 99 F (37.2 C) 98.8 F (37.1 C)  TempSrc:  Oral Oral Oral  SpO2:  100% 100% 100%  Weight:      Height:        Intake/Output Summary (Last 24 hours) at 02/16/17 1514 Last data filed at 02/16/17 1400  Gross per 24 hour  Intake             3140 ml  Output              300 ml  Net             2840 ml   [employer]  02/14/17 1726  Weight: 59 kg (130 lb)    Exam  General: Well developed, well nourished, NAD, appears stated age  HEENT: NCAT, mucous membranes moist.   Cardiovascular: S1 S2 auscultated, no rubs, murmurs or gallops. Regular rate and rhythm.  Respiratory: Clear to auscultation bilaterally with equal chest rise  Abdomen: Soft, mildly tender, mildly distended, + bowel sounds  Extremities: warm dry without cyanosis clubbing or edema  Neuro: AAOx3, nonfocal  Psych: Normal affect and demeanor with intact judgement and insight   Data Reviewed: I have personally reviewed following labs and imaging studies  CBC:  Recent Labs Lab 02/14/17 1729 02/15/17 0449  02/16/17 0417  WBC 8.1 5.7 3.7*  NEUTROABS  --   --  2.7  HGB 12.9 12.3 10.3*  HCT 36.8 37.2 31.2*  MCV 92.9 96.6 95.7  PLT 156 150 893*   Basic Metabolic Panel:  Recent Labs Lab 02/14/17 1824 02/15/17 0449 02/16/17 0417  NA 140 139 139  K 4.0 3.8 3.7  CL 108 105 111  CO2 21* 24 21*  GLUCOSE 95 94 85  BUN 14 12 12   CREATININE 0.87 0.88 0.83  CALCIUM 8.8* 8.1* 7.9*  MG  --   --  1.9   GFR: Estimated Creatinine Clearance: 77.2 mL/min (by C-G formula based on SCr of 0.83 mg/dL). Liver Function Tests:  Recent Labs Lab 02/14/17 1824 02/16/17 0417  AST 22 18  ALT 13* 12*  ALKPHOS 41 34*  BILITOT 1.3* 0.7  PROT 7.3 5.6*  ALBUMIN 4.5 3.3*    Recent Labs Lab 02/14/17 1824 02/16/17 0417  LIPASE 29 22   No results for input(s): AMMONIA in the last 168 hours. Coagulation Profile: No results for input(s): INR, PROTIME in the last 168 hours. Cardiac Enzymes: No results for input(s): CKTOTAL, CKMB, CKMBINDEX, TROPONINI in the last 168 hours. BNP (last 3 results) No results for input(s): PROBNP in the last 8760 hours. HbA1C: No results for input(s): HGBA1C in the last 72 hours. CBG: No results for input(s): GLUCAP in the last 168 hours. Lipid Profile: No results for input(s): CHOL, HDL, LDLCALC, TRIG, CHOLHDL, LDLDIRECT in the last 72 hours. Thyroid Function Tests: No results for input(s): TSH, T4TOTAL, FREET4, T3FREE, THYROIDAB in the last 72 hours. Anemia Panel: No results for input(s): VITAMINB12, FOLATE, FERRITIN, TIBC, IRON, RETICCTPCT in the last 72 hours. Urine analysis: No results found for: COLORURINE, APPEARANCEUR, LABSPEC, PHURINE, GLUCOSEU, HGBUR, BILIRUBINUR, KETONESUR, PROTEINUR, UROBILINOGEN, NITRITE, LEUKOCYTESUR Sepsis Labs: @LABRCNTIP (procalcitonin:4,lacticidven:4)  )No results found for this or any previous visit (from the past 240 hour(s)).    Radiology Studies: Ct Abdomen Pelvis W Contrast  Result Date: 02/14/2017 CLINICAL DATA:  Mid to  lower abdominal pain with nausea and vomiting 1-2 days. EXAM: CT ABDOMEN AND PELVIS WITH CONTRAST TECHNIQUE: Multidetector CT imaging of the abdomen and pelvis was performed using the standard protocol following bolus administration of intravenous contrast. CONTRAST:  134mL ISOVUE-300 IOPAMIDOL (ISOVUE-300) INJECTION 61% COMPARISON:  None. FINDINGS: Lower chest: Lung bases are normal. Hepatobiliary: Subcentimeter hypodensity over the right lobe of the liver too small to characterize but likely a cyst. Gallbladder and biliary tree are within normal. Pancreas: Within normal. Spleen: Within normal. Adrenals/Urinary Tract: Adrenal glands are normal. Kidneys are normal size without hydronephrosis or nephrolithiasis. Ureters and bladder are normal. Stomach/Bowel: Stomach is within normal. There are multiple fluid-filled prominent but nondilated mid to distal small bowel loops. Appendix is difficult to visualize but likely within normal. Colon is unremarkable. Vascular/Lymphatic: Arterial structures are within  normal. There are prominent pelvic/periuterine veins and prominent left ovarian/gonadal vein. No evidence of adenopathy. Reproductive: Uterus and ovaries are unremarkable. There is mild to moderate free fluid in the pelvis which is simple in nature. Other: No evidence of focal inflammatory change or free peritoneal air. Musculoskeletal: Degenerative change with disc disease at the L5-S1 level. Grade 2 anterolisthesis of L5 on S1 due to bilateral L5 spondylolysis. IMPRESSION: No acute findings in the abdomen/ pelvis. Mild to moderate simple free fluid in the pelvis. Prominent pelvic/ periuterine veins and left ovarian vein which can be seen with pelvic congestion syndrome. Subcentimeter right liver hypodensity too small to characterize but likely a cyst. Grade 2 anterolisthesis of L5 on S1 due to bilateral L5 spondylolysis. Electronically Signed   By: Marin Olp M.D.   On: 02/14/2017 20:57   Dg Chest Port 1  View  Result Date: 02/14/2017 CLINICAL DATA:  Abdominal pain beginning this morning. Nausea. Low back pain. EXAM: PORTABLE CHEST 1 VIEW COMPARISON:  None. FINDINGS: Patient slightly rotated to the left. Lungs are normally expanded without consolidation or effusion. Cardiomediastinal silhouette is within normal. Remaining bones and soft tissue structures are normal. IMPRESSION: No active disease. Electronically Signed   By: Marin Olp M.D.   On: 02/14/2017 18:09   Dg Abd 2 Views  Result Date: 02/16/2017 CLINICAL DATA:  Lower abdominal pain with nausea and vomiting and constipation. EXAM: ABDOMEN - 2 VIEW COMPARISON:  CT scan the abdomen and pelvis of February 14, 2017 FINDINGS: There are multiple air in fluid filled distended small bowel loops in the mid abdomen. There is stool in the left colon and some gas in the right colon. There is gas and fluid within the stomach. There is no gas or stool in the rectum. There are no abnormal soft tissue calcifications. There degenerative changes of the lower lumbar spine. IMPRESSION: Findings compatible with a mid to distal small bowel obstruction. There is no evidence of perforation. Electronically Signed   By: David  Martinique M.D.   On: 02/16/2017 08:36     Scheduled Meds: . metoCLOPramide (REGLAN) injection  5 mg Intravenous Q8H  . pantoprazole (PROTONIX) IV  40 mg Intravenous Q24H   Continuous Infusions: . sodium chloride 100 mL/hr at 02/16/17 1013     LOS: 1 day   Time Spent in minutes   35 minutes  Shaleka Brines D.O. on 02/16/2017 at 3:14 PM  Between 7am to 7pm - Pager - [phone]  After 7pm go to [email or web address] - password [email or web address]  And look for the night coverage person covering for me after hours  [facility] Office  [phone]

## 2017-02-17 ENCOUNTER — Inpatient Hospital Stay (HOSPITAL_COMMUNITY): Payer: 59

## 2017-02-17 DIAGNOSIS — D696 Thrombocytopenia, unspecified: Secondary | ICD-10-CM

## 2017-02-17 LAB — COMPREHENSIVE METABOLIC PANEL
ALK PHOS: 31 U/L — AB (ref 38–126)
ALT: 12 U/L — AB (ref 14–54)
AST: 18 U/L (ref 15–41)
Albumin: 3.2 g/dL — ABNORMAL LOW (ref 3.5–5.0)
Anion gap: 11 (ref 5–15)
BILIRUBIN TOTAL: 0.7 mg/dL (ref 0.3–1.2)
BUN: 16 mg/dL (ref 6–20)
CALCIUM: 8 mg/dL — AB (ref 8.9–10.3)
CO2: 19 mmol/L — ABNORMAL LOW (ref 22–32)
CREATININE: 0.88 mg/dL (ref 0.44–1.00)
Chloride: 111 mmol/L (ref 101–111)
Glucose, Bld: 72 mg/dL (ref 65–99)
Potassium: 3.6 mmol/L (ref 3.5–5.1)
Sodium: 141 mmol/L (ref 135–145)
Total Protein: 5.4 g/dL — ABNORMAL LOW (ref 6.5–8.1)

## 2017-02-17 LAB — CBC
HEMATOCRIT: 31.1 % — AB (ref 36.0–46.0)
HEMOGLOBIN: 10.5 g/dL — AB (ref 12.0–15.0)
MCH: 33.1 pg (ref 26.0–34.0)
MCHC: 33.8 g/dL (ref 30.0–36.0)
MCV: 98.1 fL (ref 78.0–100.0)
Platelets: 105 10*3/uL — ABNORMAL LOW (ref 150–400)
RBC: 3.17 MIL/uL — AB (ref 3.87–5.11)
RDW: 12.8 % (ref 11.5–15.5)
WBC: 3.6 10*3/uL — AB (ref 4.0–10.5)

## 2017-02-17 LAB — LIPASE, BLOOD: LIPASE: 37 U/L (ref 11–51)

## 2017-02-17 IMAGING — DX DG ABDOMEN 2V
2 series · 2 of 2 positions shown · non-contrast
Comparison: [DATE] and earlier.

CLINICAL DATA: 50 y/o female with small bowel obstruction.
Abdominal pain nausea and vomiting.

EXAM:
ABDOMEN - 2 VIEW

[abdomen erect]
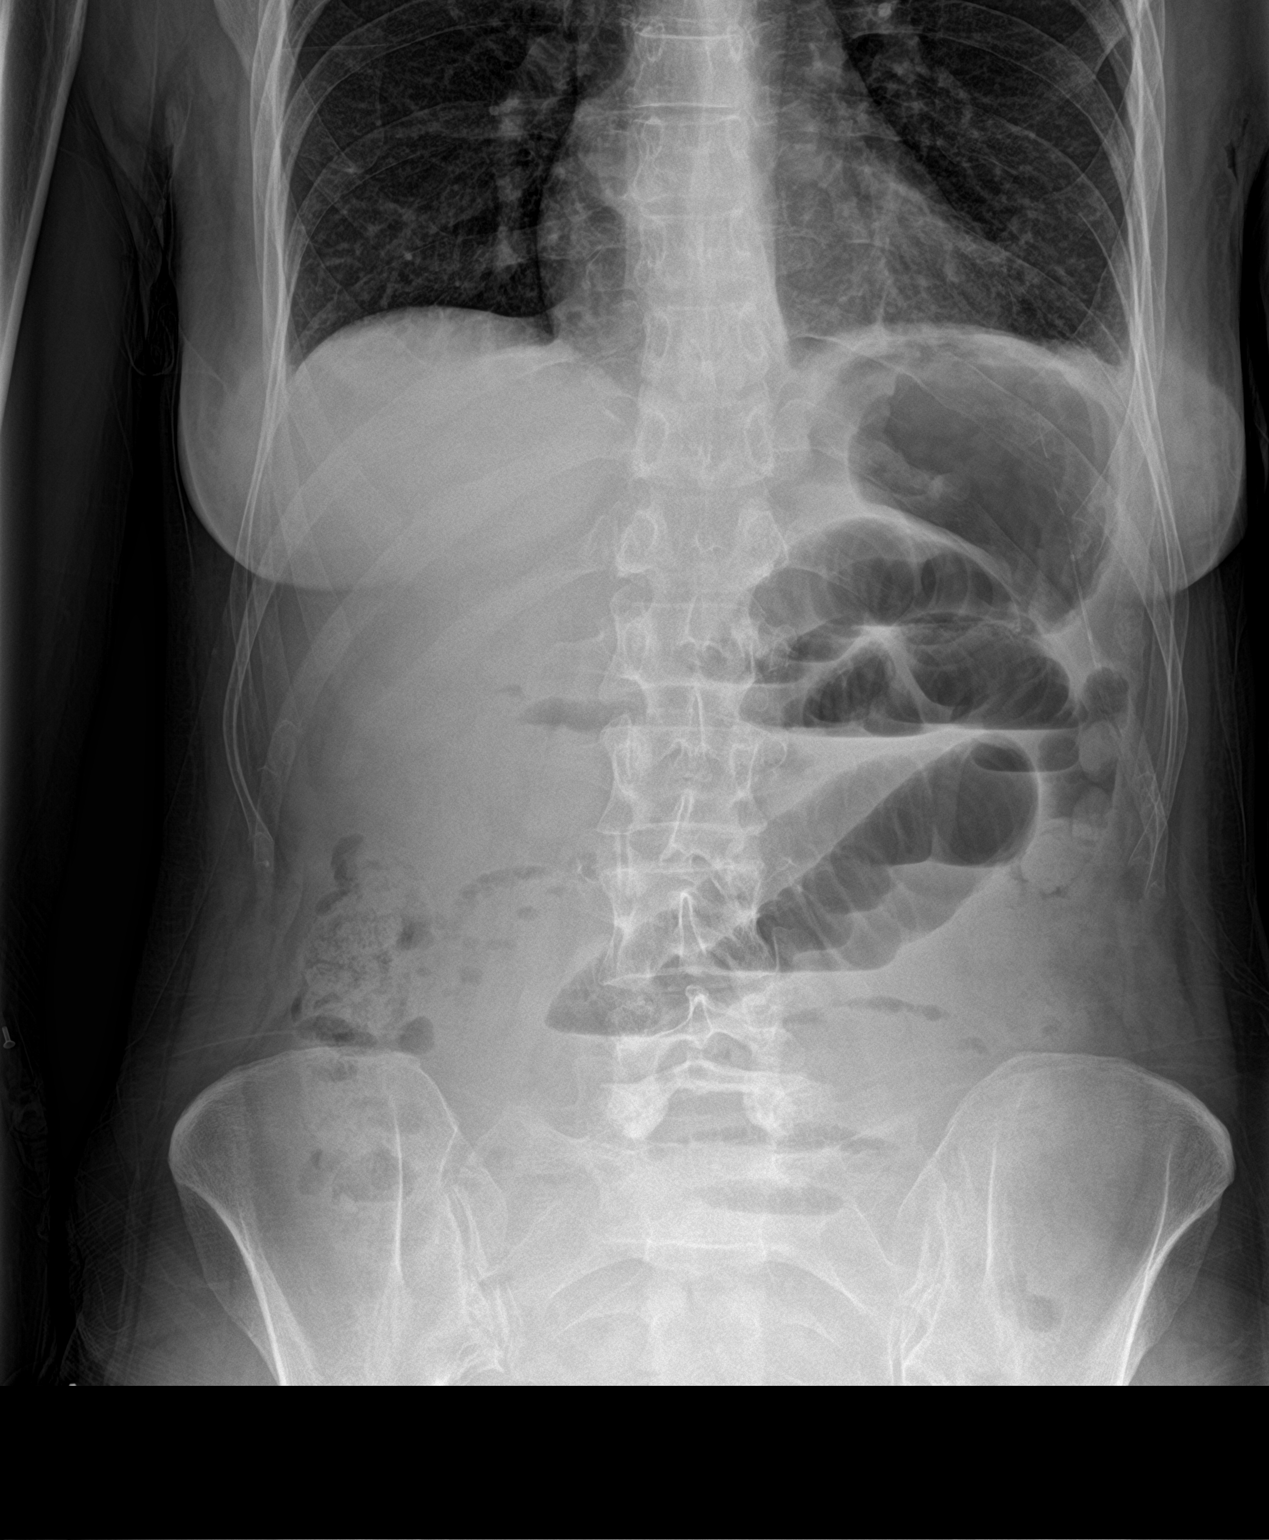

[abdomen supine]
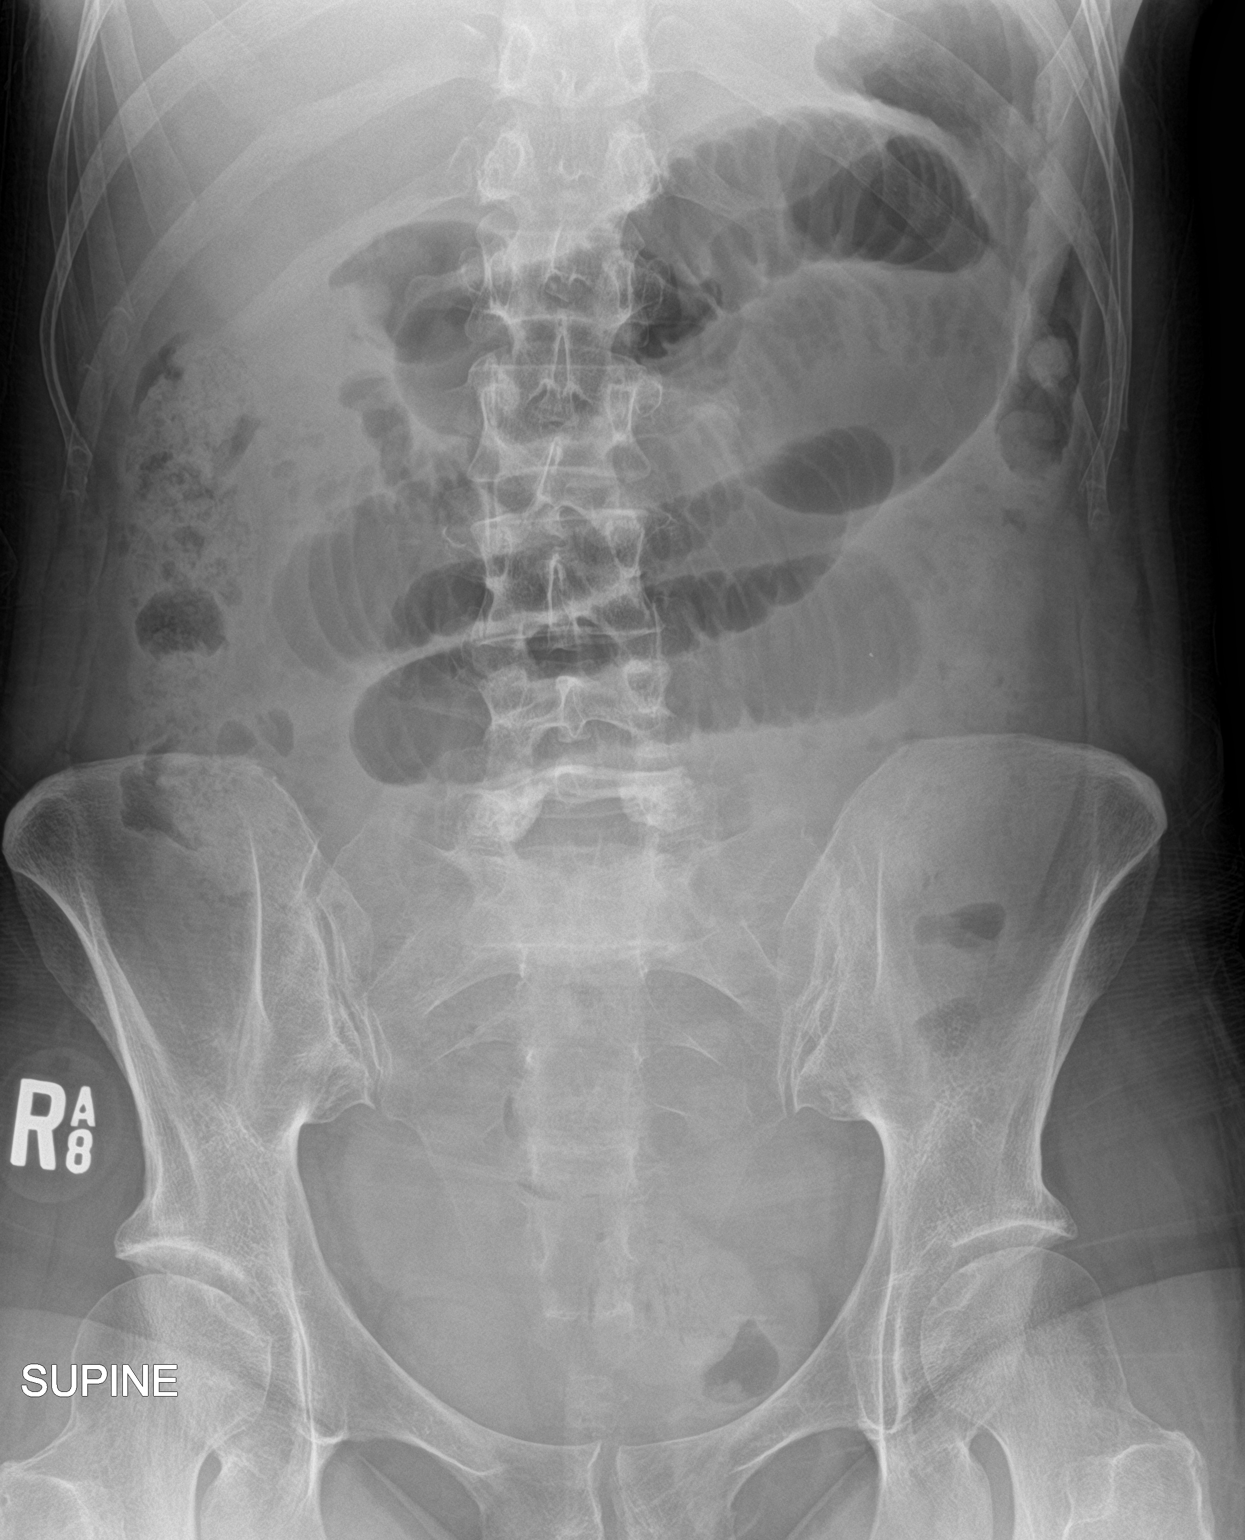

[2 of 2 positions shown; findings below may reference images not displayed]

FINDINGS: Upright and supine views of the abdomen and pelvis. Dilated mid
abdominal small bowel loops individually up to 44 mm with air-fluid
levels, appear mildly larger than yesterday. Paucity of large bowel
gas which is stable or decreased compared to yesterday. Mild gaseous
distension of the stomach. No pneumoperitoneum. Mild left lung base
atelectasis. No acute osseous abnormality identified.
IMPRESSION: Stable to mild worsening of small bowel obstruction since yesterday.
No free air.

## 2017-02-17 MED ORDER — PROCHLORPERAZINE EDISYLATE 5 MG/ML IJ SOLN
5.0000 mg | Freq: Four times a day (QID) | INTRAMUSCULAR | Status: DC | PRN
  Administered 2017-02-17 (×3): 5 mg via INTRAVENOUS
  Filled 2017-02-17 (×3): qty 2

## 2017-02-17 NOTE — Progress Notes (Signed)
[facility]/Trauma Progress Note      Subjective:  CC: abdominal pain, nausea, bloating  Pt states her pain and nausea is unchanged from yesterday. She has not had any vomiting. She had flatus this AM. Tolerating ice chips. NO fever or chills.  Objective: Vital signs in last 24 hours: Temp:  [98.2 F (36.8 C)-99.3 F (37.4 C)] 98.2 F (36.8 C) (06/14 0628) Pulse Rate:  [44-46] 44 (06/14 0644) Resp:  [16] 16 (06/14 0504) BP: (86-101)/(42-62) 100/47 (06/14 0644) SpO2:  [98 %-100 %] 99 % (06/14 0504) Last BM Date: 02/14/17  Intake/Output from previous day: 06/13 0701 - 06/14 0700 In: 2120 [P.O.:120; I.V.:2000] Out: 500 [Urine:500] Intake/Output this shift: No intake/output data recorded.  PE: Gen:  Alert, NAD, pleasant, cooperative, well appearing Card:  RRR, no M/G/R heard Pulm:  Rate and effort normal Abd: Soft, mildly distended, +BS, generalized TTP no signs of peritonitis, no HSM,  Skin: no rashes noted, warm and dry  Lab Results:   Recent Labs  02/16/17 0417 02/17/17 0508  WBC 3.7* 3.6*  HGB 10.3* 10.5*  HCT 31.2* 31.1*  PLT 116* 105*   BMET  Recent Labs  02/16/17 0417 02/17/17 0508  NA 139 141  K 3.7 3.6  CL 111 111  CO2 21* 19*  GLUCOSE 85 72  BUN 12 16  CREATININE 0.83 0.88  CALCIUM 7.9* 8.0*   PT/INR No results for input(s): LABPROT, INR in the last 72 hours. CMP     Component Value Date/Time   NA 141 02/17/2017 0508   K 3.6 02/17/2017 0508   CL 111 02/17/2017 0508   CO2 19 (L) 02/17/2017 0508   GLUCOSE 72 02/17/2017 0508   BUN 16 02/17/2017 0508   CREATININE 0.88 02/17/2017 0508   CALCIUM 8.0 (L) 02/17/2017 0508   PROT 5.4 (L) 02/17/2017 0508   ALBUMIN 3.2 (L) 02/17/2017 0508   AST 18 02/17/2017 0508   ALT 12 (L) 02/17/2017 0508   ALKPHOS 31 (L) 02/17/2017 0508   BILITOT 0.7 02/17/2017 0508   GFRNONAA >60 02/17/2017 0508   GFRAA >60 02/17/2017 0508   Lipase     Component Value Date/Time   LIPASE 37 02/17/2017  0508    Studies/Results: Dg Abd 2 Views  Result Date: 02/17/2017 CLINICAL DATA:  51 y/o female with small bowel obstruction. Abdominal pain nausea and vomiting. EXAM: ABDOMEN - 2 VIEW COMPARISON:  02/16/2017 and earlier. FINDINGS: Upright and supine views of the abdomen and pelvis. Dilated mid abdominal small bowel loops individually up to 44 mm with air-fluid levels, appear mildly larger than yesterday. Paucity of large bowel gas which is stable or decreased compared to yesterday. Mild gaseous distension of the stomach. No pneumoperitoneum. Mild left lung base atelectasis. No acute osseous abnormality identified. IMPRESSION: Stable to mild worsening of small bowel obstruction since yesterday. No free air. Electronically Signed   By: Genevie Ann M.D.   On: 02/17/2017 08:05   Dg Abd 2 Views  Result Date: 02/16/2017 CLINICAL DATA:  Lower abdominal pain with nausea and vomiting and constipation. EXAM: ABDOMEN - 2 VIEW COMPARISON:  CT scan the abdomen and pelvis of February 14, 2017 FINDINGS: There are multiple air in fluid filled distended small bowel loops in the mid abdomen. There is stool in the left colon and some gas in the right colon. There is gas and fluid within the stomach. There is no gas or stool in the rectum. There are no abnormal soft tissue calcifications. There degenerative changes  of the lower lumbar spine. IMPRESSION: Findings compatible with a mid to distal small bowel obstruction. There is no evidence of perforation. Electronically Signed   By: David  Martinique M.D.   On: 02/16/2017 08:36    Anti-infectives: Anti-infectives    None       Assessment/Plan Gastroenteritis vs ileus Hx of tubal ligation - pt having flatus but still having bloating, pain and nausea - recommend sips from the floor and advance diet slowly even in the presence of flatus  We will continue to follow.   LOS: 2 days    Kalman Drape , [facility] 02/17/2017, 9:41 AM Pager:  [phone] Consults: [phone] Mon-Fri 7:00 am-4:30 pm Sat-Sun 7:00 am-11:30 am

## 2017-02-17 NOTE — Progress Notes (Signed)
PROGRESS NOTE    LEEZA HEINER  TDV:[id number] DOB: 11-12-1965 DOA: 02/14/2017 PCP: [name], MD   Chief Complaint  Patient presents with  . Abdominal Pain  . Emesis    Brief Narrative:  HPI On 02/14/2017 by Dr. Karmen Bongo GENNETTE SHADIX is a 51 y.o. female with no significant medical history presenting with apparent viral gastroenteritis.  Husband had a stomach virus over the weekend; they stayed in a hotel room together.  She awoke this AM with mild abdominal pain and nausea. Went to work and about 930 developed more severe abdominal pain and n/v.  Took Zofran and went to sleep for a few hours.  Awoke and had more n/v with severe abdominal pain.  "It just wouldn't go away."  Pain started radiating into her lower back and she just couldn't get relief.  She finally told her husband she had to come to the ER, called 911.  They drove back from [place] yesterday and she felt well at that time. Assessment & Plan   Intractable nausea, severe viral gastroenteritis -CT and pelvis showed no acute findings, mid to moderate simple free fluid in the pelvis -Continue conservative management -Antiemetics, IV fluids  ? Small bowel obstruction versus ileus -Abdominal x-ray today shows mid to distal small bowel obstruction, no evidence of perforation -She has not had a bowel movement since Monday, 02/14/2017 -Continues to have nausea but active vomiting.  -Gen. surgery consulted and appreciated -Not sure if this is truly small bowel obstruction given CT scan on 02/14/2017 was unremarkable -Repeat Abd xray today: Stable to mild worsening of SBO. No free air -Will place patient on ice chips -She does have bowel sounds, with flatus, but no BM  Sinus bradycardia -Currently asymptomatic, continue to monitor on telemetry -TSH 1.719  Hypotension -Per patient, blood pressure generally runs on the lower side. -Orthostatic vital signs negative, patient remains asymptomatic -Continue IV  hydration  Back pain -Continue pain control -CTabd pelvis shows grade 2 anterior listhesis of L5 on S1 due to bilateral L5 spondylolysis  ? Pelvic congestion syndrome -CT abdomen and pelvis showed prominent pelvic/uterine veins and left ovarian vein -Will need outpatient follow-up with her OB/GYN on discharge  Thrombocytopenia -Platelets currently 105, were 156 on admission -Currently not on chemical DVT prophylaxis -Possibly related to viral etiology -Continue to monitor CBC  DVT Prophylaxis  SCDs  Code Status: Full  Family Communication: None at  bedside  Disposition Plan: Admitted. Home when stable.  Consultants Gen. surgery  Procedures  None  Antibiotics   Anti-infectives    None      Subjective:   [name] seen and examined today. Continues to have abdominal pain and nausea, denies vomiting. Denies any improvement or worsening since yesterday. Has had some gas passage. States she is thirsty. Denies chest pain, shortness of breath, dizziness, headache.   Objective:   Vitals:   02/17/17 0125 02/17/17 0504 02/17/17 0628 02/17/17 0644  BP: (!) 96/49 (!) 92/45  (!) 100/47  Pulse: (!) 45 (!) 46  (!) 44  Resp:  16    Temp:  99.3 F (37.4 C) 98.2 F (36.8 C)   TempSrc:  Oral Oral   SpO2:  99%    Weight:      Height:        Intake/Output Summary (Last 24 hours) at 02/17/17 1310 Last data filed at 02/17/17 1030  Gross per 24 hour  Intake  1600 ml  Output              450 ml  Net             1150 ml   [employer] Weights   02/14/17 1726  Weight: 59 kg (130 lb)   Exam  General: Well developed, well nourished, NAD, appears stated age  HEENT: NCAT,  mucous membranes moist.   Cardiovascular: S1 S2 auscultated, bradycardic, no murmurs  Respiratory: Clear to auscultation bilaterally with equal chest rise, no wheezing   Abdomen: Soft, mildly tender- mid abdomen, mildly distended, + bowel sounds  Extremities: warm dry without cyanosis clubbing  or edema  Neuro: AAOx3, nonfocal  Psych: Appropriate mood and affect   Data Reviewed: I have personally reviewed following labs and imaging studies  CBC:  Recent Labs Lab 02/14/17 1729 02/15/17 0449 02/16/17 0417 02/17/17 0508  WBC 8.1 5.7 3.7* 3.6*  NEUTROABS  --   --  2.7  --   HGB 12.9 12.3 10.3* 10.5*  HCT 36.8 37.2 31.2* 31.1*  MCV 92.9 96.6 95.7 98.1  PLT 156 150 116* 570*   Basic Metabolic Panel:  Recent Labs Lab 02/14/17 1824 02/15/17 0449 02/16/17 0417 02/17/17 0508  NA 140 139 139 141  K 4.0 3.8 3.7 3.6  CL 108 105 111 111  CO2 21* 24 21* 19*  GLUCOSE 95 94 85 72  BUN 14 12 12 16   CREATININE 0.87 0.88 0.83 0.88  CALCIUM 8.8* 8.1* 7.9* 8.0*  MG  --   --  1.9  --    GFR: Estimated Creatinine Clearance: 72.8 mL/min (by C-G formula based on SCr of 0.88 mg/dL). Liver Function Tests:  Recent Labs Lab 02/14/17 1824 02/16/17 0417 02/17/17 0508  AST 22 18 18   ALT 13* 12* 12*  ALKPHOS 41 34* 31*  BILITOT 1.3* 0.7 0.7  PROT 7.3 5.6* 5.4*  ALBUMIN 4.5 3.3* 3.2*    Recent Labs Lab 02/14/17 1824 02/16/17 0417 02/17/17 0508  LIPASE 29 22 37   No results for input(s): AMMONIA in the last 168 hours. Coagulation Profile: No results for input(s): INR, PROTIME in the last 168 hours. Cardiac Enzymes: No results for input(s): CKTOTAL, CKMB, CKMBINDEX, TROPONINI in the last 168 hours. BNP (last 3 results) No results for input(s): PROBNP in the last 8760 hours. HbA1C: No results for input(s): HGBA1C in the last 72 hours. CBG: No results for input(s): GLUCAP in the last 168 hours. Lipid Profile: No results for input(s): CHOL, HDL, LDLCALC, TRIG, CHOLHDL, LDLDIRECT in the last 72 hours. Thyroid Function Tests:  Recent Labs  02/16/17 1539  TSH 1.719   Anemia Panel: No results for input(s): VITAMINB12, FOLATE, FERRITIN, TIBC, IRON, RETICCTPCT in the last 72 hours. Urine analysis: No results found for: COLORURINE, APPEARANCEUR, LABSPEC, PHURINE,  GLUCOSEU, HGBUR, BILIRUBINUR, KETONESUR, PROTEINUR, UROBILINOGEN, NITRITE, LEUKOCYTESUR Sepsis Labs: @LABRCNTIP (procalcitonin:4,lacticidven:4)  )No results found for this or any previous visit (from the past 240 hour(s)).    Radiology Studies: Dg Abd 2 Views  Result Date: 02/17/2017 CLINICAL DATA:  51 y/o female with small bowel obstruction. Abdominal pain nausea and vomiting. EXAM: ABDOMEN - 2 VIEW COMPARISON:  02/16/2017 and earlier. FINDINGS: Upright and supine views of the abdomen and pelvis. Dilated mid abdominal small bowel loops individually up to 44 mm with air-fluid levels, appear mildly larger than yesterday. Paucity of large bowel gas which is stable or decreased compared to yesterday. Mild gaseous distension of the stomach. No pneumoperitoneum. Mild left lung base atelectasis.  No acute osseous abnormality identified. IMPRESSION: Stable to mild worsening of small bowel obstruction since yesterday. No free air. Electronically Signed   By: Genevie Ann M.D.   On: 02/17/2017 08:05   Dg Abd 2 Views  Result Date: 02/16/2017 CLINICAL DATA:  Lower abdominal pain with nausea and vomiting and constipation. EXAM: ABDOMEN - 2 VIEW COMPARISON:  CT scan the abdomen and pelvis of February 14, 2017 FINDINGS: There are multiple air in fluid filled distended small bowel loops in the mid abdomen. There is stool in the left colon and some gas in the right colon. There is gas and fluid within the stomach. There is no gas or stool in the rectum. There are no abnormal soft tissue calcifications. There degenerative changes of the lower lumbar spine. IMPRESSION: Findings compatible with a mid to distal small bowel obstruction. There is no evidence of perforation. Electronically Signed   By: David  Martinique M.D.   On: 02/16/2017 08:36     Scheduled Meds: . metoCLOPramide (REGLAN) injection  5 mg Intravenous Q8H  . pantoprazole (PROTONIX) IV  40 mg Intravenous Q24H   Continuous Infusions: . sodium chloride 100 mL/hr at  02/17/17 1110     LOS: 2 days   Time Spent in minutes   35 minutes  Marykathryn Carboni D.O. on 02/17/2017 at 1:10 PM  Between 7am to 7pm - Pager - [phone]  After 7pm go to [email or web address] - password [email or web address]  And look for the night coverage person covering for me after hours  [facility] Office  [phone]

## 2017-02-17 NOTE — Progress Notes (Signed)
Paged x 2 times for an order for VTE prophylaxis. Will write a physician sticky note.   Paged Dr. Baltazar Najjar for diet orders and BP 86/42, pulse 45. Orders received.

## 2017-02-18 ENCOUNTER — Inpatient Hospital Stay (HOSPITAL_COMMUNITY): Payer: 59

## 2017-02-18 DIAGNOSIS — D649 Anemia, unspecified: Secondary | ICD-10-CM

## 2017-02-18 DIAGNOSIS — Z0189 Encounter for other specified special examinations: Secondary | ICD-10-CM

## 2017-02-18 IMAGING — DX DG ABD PORTABLE 1V
1 series · 1 of 1 positions shown · non-contrast
Comparison: [DATE] and earlier.

CLINICAL DATA: 50-year-old female status post NG tube placement.

EXAM:
PORTABLE ABDOMEN - 1 VIEW

[abdomen kub]
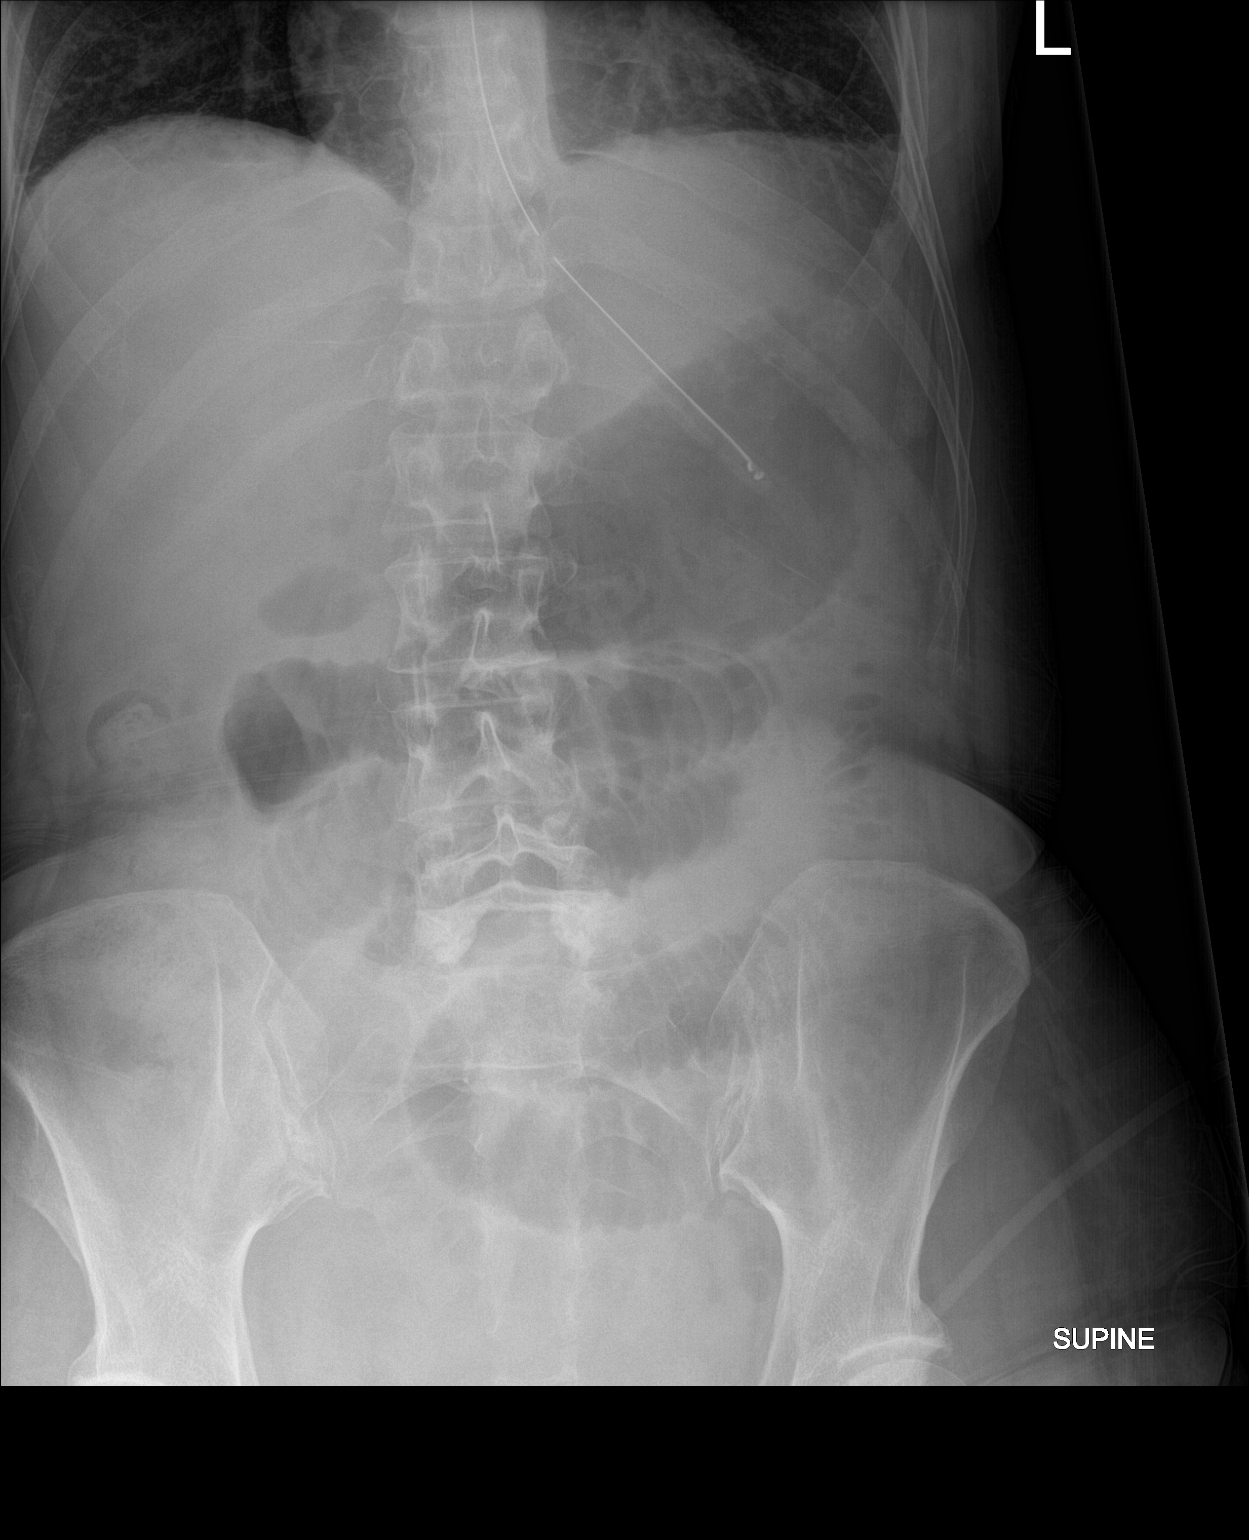

[1 of 1 positions shown; findings below may reference images not displayed]

FINDINGS: Portable AP supine view at [TQ] hours. Enteric tube courses to the
left upper quadrant, tip projects over the gastric body. Side hole
is present at the level of the gastric cardia. Stable lung bases.
Continued gas-filled dilated mid abdominal small bowel loops with a
paucity of large bowel gas, although the loops appear mildly less
distended since yesterday (now 38 mm diameter). Stable visualized
osseous structures.
IMPRESSION: 1. NG tube placed terminating in the stomach. Recommend tube
advancement of 5 cm to ensure side hole placement within the
stomach.
2. Continued small bowel obstruction.

## 2017-02-18 IMAGING — DX DG ABD PORTABLE 1V
1 series · 1 of 1 positions shown · non-contrast
Comparison: [DATE]

CLINICAL DATA: Small bowel protocol 8 hour delay

EXAM:
PORTABLE ABDOMEN - 1 VIEW

[abdomen kub]
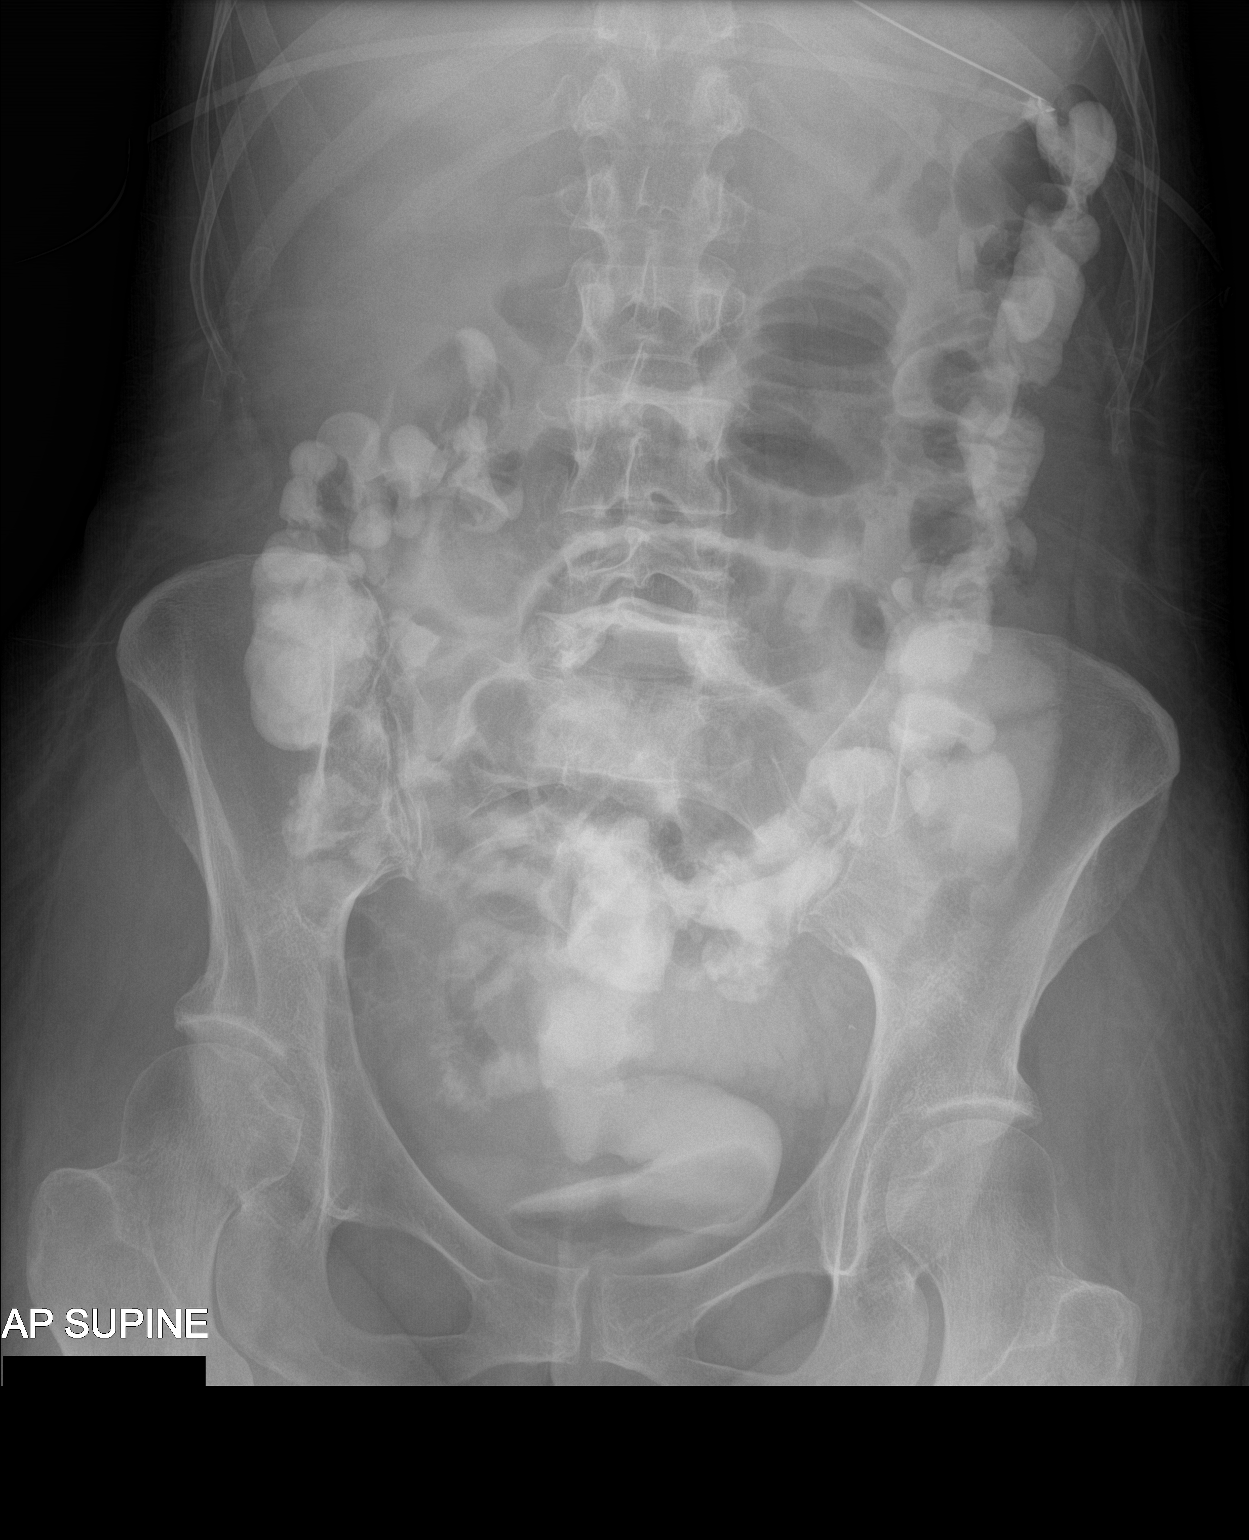

[1 of 1 positions shown; findings below may reference images not displayed]

FINDINGS: Esophageal tube tip is in the left upper quadrant. Persistent
dilated loops of small bowel in the central abdomen measuring up to
3.3 cm. Majority of the contrast is in the colon.
IMPRESSION: 1. Continue dilatation of central small bowel loops, slightly
improved since [DATE].
[DATE]. There is contrast present within the colon and rectum.

## 2017-02-18 MED ORDER — DIATRIZOATE MEGLUMINE & SODIUM 66-10 % PO SOLN
90.0000 mL | Freq: Once | ORAL | Status: AC
  Administered 2017-02-18: 90 mL via NASOGASTRIC
  Filled 2017-02-18: qty 90

## 2017-02-18 MED ORDER — PHENOL 1.4 % MT LIQD
1.0000 | OROMUCOSAL | Status: DC | PRN
  Filled 2017-02-18: qty 177

## 2017-02-18 MED ORDER — MORPHINE SULFATE (PF) 2 MG/ML IV SOLN
2.0000 mg | INTRAVENOUS | Status: DC | PRN
Start: 2017-02-18 — End: 2017-02-20
  Administered 2017-02-18: 2 mg via INTRAVENOUS
  Filled 2017-02-18: qty 1

## 2017-02-18 NOTE — Progress Notes (Signed)
Patient tolerated Ng tube placement . Checked placement via auscultation and order x -ray for confirmation. We will continue to assess.

## 2017-02-18 NOTE — Progress Notes (Signed)
PROGRESS NOTE    [name]  PQZ:[id number] DOB: [birth year] DOA: 02/14/2017 PCP: [name], MD   Chief Complaint  Patient presents with  . Abdominal Pain  . Emesis    Brief Narrative:  HPI On 02/14/2017 by Dr. Karmen Bongo Gina Fisher is a 51 y.o. female with no significant medical history presenting with apparent viral gastroenteritis.  Husband had a stomach virus over the weekend; they stayed in a hotel room together.  She awoke this AM with mild abdominal pain and nausea. Went to work and about 930 developed more severe abdominal pain and n/v.  Took Zofran and went to sleep for a few hours.  Awoke and had more n/v with severe abdominal pain.  "It just wouldn't go away."  Pain started radiating into her lower back and she just couldn't get relief.  She finally told her husband she had to come to the ER, called 911.  They drove back from [place] yesterday and she felt well at that time.  Interim history Overnight, patient experienced 2 episodes of vomiting. NG tube placed.   Assessment & Plan   Intractable nausea, severe viral gastroenteritis -CT and pelvis showed no acute findings, mid to moderate simple free fluid in the pelvis -Continue conservative management -Antiemetics, IV fluids -Will change IV pain medication to morphine  ? Small bowel obstruction versus ileus -Abdominal x-ray today shows mid to distal small bowel obstruction, no evidence of perforation -She has not had a bowel movement since Monday, 02/14/2017 -Gen. surgery consulted and appreciated -Not sure if this is truly small bowel obstruction given CT scan on 02/14/2017 was unremarkable -Repeat Abd xray today: Stable to mild worsening of SBO. No free air -The morning of 6/15, patient experienced 2 episodes of vomiting. NG tube placed. Patient feeling better.  -NG output thus far 1000cc -General surgery ordering SBO protocol, gastrografin with 8hr delay/abd xray -Continue NPO and conservative  management -Patient had once abdominal surgery about 17 years ago- tubal ligation  Sinus bradycardia -Currently asymptomatic, continue to monitor on telemetry -TSH 1.719  Hypotension -Per patient, blood pressure generally runs on the lower side. -Orthostatic vital signs negative, patient remains asymptomatic -Continue IV hydration  Back pain -Continue pain control -CTabd pelvis shows grade 2 anterior listhesis of L5 on S1 due to bilateral L5 spondylolysis  ? Pelvic congestion syndrome -CT abdomen and pelvis showed prominent pelvic/uterine veins and left ovarian vein -Will need outpatient follow-up with her OB/GYN on discharge  Thrombocytopenia -Platelets currently 105, were 156 on admission -Currently not on chemical DVT prophylaxis -Possibly related to viral etiology -Continue to monitor CBC  Normocytic anemia -Hemoglobin upon admission 12.9, currently 10.5. Suspect drop secondary to dilutional component as patient currently receiving IV fluids -Continue to monitor CBC  DVT Prophylaxis  SCDs  Code Status: Full  Family Communication: None at  bedside  Disposition Plan: Admitted. Continue conservative management for possible SBO. Suspect dispo to home when stable.  Consultants Gen. surgery  Procedures  None  Antibiotics   Anti-infectives    None      Subjective:   Gina Fisher seen and examined today. Patient Continues to have abdominal pain and nausea, denies vomiting. Denies any improvement or worsening since yesterday. Has had some gas passage. States she is thirsty. Denies chest pain, shortness of breath, dizziness, headache.   Objective:   Vitals:   02/17/17 0644 02/17/17 1356 02/17/17 1900 02/18/17 0422  BP: (!) 100/47 (!) 96/47 (!) 103/41 (!) 105/50  Pulse: (!) 44 Marland Kitchen)  58 (!) 44 (!) 45  Resp:  14 14 14   Temp:  99 F (37.2 C) 99.5 F (37.5 C) 98.2 F (36.8 C)  TempSrc:  Oral Oral Oral  SpO2:  100% 100% 97%  Weight:      Height:         Intake/Output Summary (Last 24 hours) at 02/18/17 1302 Last data filed at 02/18/17 1140  Gross per 24 hour  Intake          8332.67 ml  Output             1550 ml  Net          6782.67 ml   Filed Weights   02/14/17 1726  Weight: 59 kg (130 lb)   Exam  General: Well developed, well nourished, NAD, appears stated age  HEENT: NCAT, mucous membranes moist. NG tube in place  Cardiovascular: S1 S2 auscultated, no murmurs, rubs or gallops. Bradycardic  Respiratory: Clear to auscultation bilaterally with equal chest rise  Abdomen: Soft, mild mid abdominal tenderness, mildly distended, few bowel sounds  Extremities: warm dry without cyanosis clubbing or edema  Neuro: AAOx3, nonfocal  Psych: appropriate mood and affect  Data Reviewed: I have personally reviewed following labs and imaging studies  CBC:  Recent Labs Lab 02/14/17 1729 02/15/17 0449 02/16/17 0417 02/17/17 0508  WBC 8.1 5.7 3.7* 3.6*  NEUTROABS  --   --  2.7  --   HGB 12.9 12.3 10.3* 10.5*  HCT 36.8 37.2 31.2* 31.1*  MCV 92.9 96.6 95.7 98.1  PLT 156 150 116* 470*   Basic Metabolic Panel:  Recent Labs Lab 02/14/17 1824 02/15/17 0449 02/16/17 0417 02/17/17 0508  NA 140 139 139 141  K 4.0 3.8 3.7 3.6  CL 108 105 111 111  CO2 21* 24 21* 19*  GLUCOSE 95 94 85 72  BUN 14 12 12 16   CREATININE 0.87 0.88 0.83 0.88  CALCIUM 8.8* 8.1* 7.9* 8.0*  MG  --   --  1.9  --    GFR: Estimated Creatinine Clearance: 72.8 mL/min (by C-G formula based on SCr of 0.88 mg/dL). Liver Function Tests:  Recent Labs Lab 02/14/17 1824 02/16/17 0417 02/17/17 0508  AST 22 18 18   ALT 13* 12* 12*  ALKPHOS 41 34* 31*  BILITOT 1.3* 0.7 0.7  PROT 7.3 5.6* 5.4*  ALBUMIN 4.5 3.3* 3.2*    Recent Labs Lab 02/14/17 1824 02/16/17 0417 02/17/17 0508  LIPASE 29 22 37   No results for input(s): AMMONIA in the last 168 hours. Coagulation Profile: No results for input(s): INR, PROTIME in the last 168 hours. Cardiac  Enzymes: No results for input(s): CKTOTAL, CKMB, CKMBINDEX, TROPONINI in the last 168 hours. BNP (last 3 results) No results for input(s): PROBNP in the last 8760 hours. HbA1C: No results for input(s): HGBA1C in the last 72 hours. CBG: No results for input(s): GLUCAP in the last 168 hours. Lipid Profile: No results for input(s): CHOL, HDL, LDLCALC, TRIG, CHOLHDL, LDLDIRECT in the last 72 hours. Thyroid Function Tests:  Recent Labs  02/16/17 1539  TSH 1.719   Anemia Panel: No results for input(s): VITAMINB12, FOLATE, FERRITIN, TIBC, IRON, RETICCTPCT in the last 72 hours. Urine analysis: No results found for: COLORURINE, APPEARANCEUR, LABSPEC, PHURINE, GLUCOSEU, HGBUR, BILIRUBINUR, KETONESUR, PROTEINUR, UROBILINOGEN, NITRITE, LEUKOCYTESUR Sepsis Labs: @LABRCNTIP (procalcitonin:4,lacticidven:4)  )No results found for this or any previous visit (from the past 240 hour(s)).    Radiology Studies: Dg Abd 2 Views  Result Date: 02/17/2017  CLINICAL DATA:  51 y/o female with small bowel obstruction. Abdominal pain nausea and vomiting. EXAM: ABDOMEN - 2 VIEW COMPARISON:  02/16/2017 and earlier. FINDINGS: Upright and supine views of the abdomen and pelvis. Dilated mid abdominal small bowel loops individually up to 44 mm with air-fluid levels, appear mildly larger than yesterday. Paucity of large bowel gas which is stable or decreased compared to yesterday. Mild gaseous distension of the stomach. No pneumoperitoneum. Mild left lung base atelectasis. No acute osseous abnormality identified. IMPRESSION: Stable to mild worsening of small bowel obstruction since yesterday. No free air. Electronically Signed   By: Genevie Ann M.D.   On: 02/17/2017 08:05   Dg Abd Portable 1v  Result Date: 02/18/2017 CLINICAL DATA:  51 year old female status post NG tube placement. EXAM: PORTABLE ABDOMEN - 1 VIEW COMPARISON:  02/17/2017 and earlier. FINDINGS: Portable AP supine view at 0759 hours. Enteric tube courses to the  left upper quadrant, tip projects over the gastric body. Side hole is present at the level of the gastric cardia. Stable lung bases. Continued gas-filled dilated mid abdominal small bowel loops with a paucity of large bowel gas, although the loops appear mildly less distended since yesterday (now 38 mm diameter). Stable visualized osseous structures. IMPRESSION: 1. NG tube placed terminating in the stomach. Recommend tube advancement of 5 cm to ensure side hole placement within the stomach. 2. Continued small bowel obstruction. Electronically Signed   By: Genevie Ann M.D.   On: 02/18/2017 08:25     Scheduled Meds: . metoCLOPramide (REGLAN) injection  5 mg Intravenous Q8H  . pantoprazole (PROTONIX) IV  40 mg Intravenous Q24H   Continuous Infusions: . sodium chloride 100 mL/hr at 02/18/17 0900     LOS: 3 days   Time Spent in minutes   35 minutes  Mayling Aber D.O. on 02/18/2017 at 1:02 PM  Between 7am to 7pm - Pager - [phone]  After 7pm go to [email or web address] - password [email or web address]  And look for the night coverage person covering for me after hours  [facility] Office  [phone]

## 2017-02-18 NOTE — Progress Notes (Signed)
Patient report vomiting twice with medium amount. Patient was assessed explained NG tube placement and agrees with the plan.

## 2017-02-18 NOTE — Progress Notes (Signed)
[facility]/Trauma Progress Note      Subjective:  CC: Abdominal pain and bloating  Patient states yesterday she had a bowel movement and then shortly thereafter had vomiting. NG tube was placed last night and patient is feeling better. She still having mild abdominal pain in the mid abdomen. She's not had any flatus today. No nausea. No fever or chills over night.  Objective: Vital signs in last 24 hours: Temp:  [98.2 F (36.8 C)-99.5 F (37.5 C)] 98.2 F (36.8 C) (06/15 0422) Pulse Rate:  [44-58] 45 (06/15 0422) Resp:  [14] 14 (06/15 0422) BP: (96-105)/(41-50) 105/50 (06/15 0422) SpO2:  [97 %-100 %] 97 % (06/15 0422) Last BM Date: 02/14/17  Intake/Output from previous day: 06/14 0701 - 06/15 0700 In: 8332.7 [P.O.:240; I.V.:8072.7; NG/GT:20] Out: 1100 [Urine:700; Emesis/NG output:400] Intake/Output this shift: No intake/output data recorded.  PE: Gen:  Alert, NAD, pleasant, cooperative, well-appearing Card:  Bradycardia, regular rhythm no M/G/R heard Pulm:  Rate and effort normal Abd: Soft, nondistended, hypoactive BS, mild generalized TTP, no signs of peritonitis, no HSM Skin: no rashes noted, warm and dry  Lab Results:   Recent Labs  02/16/17 0417 02/17/17 0508  WBC 3.7* 3.6*  HGB 10.3* 10.5*  HCT 31.2* 31.1*  PLT 116* 105*   BMET  Recent Labs  02/16/17 0417 02/17/17 0508  NA 139 141  K 3.7 3.6  CL 111 111  CO2 21* 19*  GLUCOSE 85 72  BUN 12 16  CREATININE 0.83 0.88  CALCIUM 7.9* 8.0*   PT/INR No results for input(s): LABPROT, INR in the last 72 hours. CMP     Component Value Date/Time   NA 141 02/17/2017 0508   K 3.6 02/17/2017 0508   CL 111 02/17/2017 0508   CO2 19 (L) 02/17/2017 0508   GLUCOSE 72 02/17/2017 0508   BUN 16 02/17/2017 0508   CREATININE 0.88 02/17/2017 0508   CALCIUM 8.0 (L) 02/17/2017 0508   PROT 5.4 (L) 02/17/2017 0508   ALBUMIN 3.2 (L) 02/17/2017 0508   AST 18 02/17/2017 0508   ALT 12 (L) 02/17/2017 0508    ALKPHOS 31 (L) 02/17/2017 0508   BILITOT 0.7 02/17/2017 0508   GFRNONAA >60 02/17/2017 0508   GFRAA >60 02/17/2017 0508   Lipase     Component Value Date/Time   LIPASE 37 02/17/2017 0508    Studies/Results: Dg Abd 2 Views  Result Date: 02/17/2017 CLINICAL DATA:  51 y/o female with small bowel obstruction. Abdominal pain nausea and vomiting. EXAM: ABDOMEN - 2 VIEW COMPARISON:  02/16/2017 and earlier. FINDINGS: Upright and supine views of the abdomen and pelvis. Dilated mid abdominal small bowel loops individually up to 44 mm with air-fluid levels, appear mildly larger than yesterday. Paucity of large bowel gas which is stable or decreased compared to yesterday. Mild gaseous distension of the stomach. No pneumoperitoneum. Mild left lung base atelectasis. No acute osseous abnormality identified. IMPRESSION: Stable to mild worsening of small bowel obstruction since yesterday. No free air. Electronically Signed   By: Genevie Ann M.D.   On: 02/17/2017 08:05   Dg Abd Portable 1v  Result Date: 02/18/2017 CLINICAL DATA:  51 year old female status post NG tube placement. EXAM: PORTABLE ABDOMEN - 1 VIEW COMPARISON:  02/17/2017 and earlier. FINDINGS: Portable AP supine view at 0759 hours. Enteric tube courses to the left upper quadrant, tip projects over the gastric body. Side hole is present at the level of the gastric cardia. Stable lung bases. Continued gas-filled dilated mid  abdominal small bowel loops with a paucity of large bowel gas, although the loops appear mildly less distended since yesterday (now 38 mm diameter). Stable visualized osseous structures. IMPRESSION: 1. NG tube placed terminating in the stomach. Recommend tube advancement of 5 cm to ensure side hole placement within the stomach. 2. Continued small bowel obstruction. Electronically Signed   By: Genevie Ann M.D.   On: 02/18/2017 08:25    Anti-infectives: Anti-infectives    None       Assessment/Plan Gastroenteritis vs ileus vs  SBO Hx of tubal ligation - vomiting yesterday, NGT placed, pt feels better - Continue NGT - SBO protocol ordered, gastrografin with 8 hr delay abd xray  Plan: We'll hold off on surgical intervention at this time until results of 8 hour delay abdominal film. We will continue to follow.    LOS: 3 days    Kalman Drape , [facility] Surgery 02/18/2017, 9:58 AM Pager: [phone] Consults: [phone] Mon-Fri 7:00 am-4:30 pm Sat-Sun 7:00 am-11:30 am

## 2017-02-18 NOTE — Progress Notes (Signed)
Patients mother called asking how she was doing. RN informed mother that I cannot give any information over the phone, and that patient has to approve all individuals that hospital staff speaks with.   RN also informed patients mother that she can talk with the patient, and gave her the number to call into the patients room

## 2017-02-19 ENCOUNTER — Inpatient Hospital Stay (HOSPITAL_COMMUNITY): Payer: 59

## 2017-02-19 LAB — CBC
HCT: 31.4 % — ABNORMAL LOW (ref 36.0–46.0)
HEMOGLOBIN: 10.4 g/dL — AB (ref 12.0–15.0)
MCH: 31.9 pg (ref 26.0–34.0)
MCHC: 33.1 g/dL (ref 30.0–36.0)
MCV: 96.3 fL (ref 78.0–100.0)
Platelets: 122 10*3/uL — ABNORMAL LOW (ref 150–400)
RBC: 3.26 MIL/uL — AB (ref 3.87–5.11)
RDW: 12.9 % (ref 11.5–15.5)
WBC: 4.7 10*3/uL (ref 4.0–10.5)

## 2017-02-19 LAB — BASIC METABOLIC PANEL
Anion gap: 7 (ref 5–15)
BUN: 17 mg/dL (ref 6–20)
CHLORIDE: 117 mmol/L — AB (ref 101–111)
CO2: 22 mmol/L (ref 22–32)
Calcium: 8.2 mg/dL — ABNORMAL LOW (ref 8.9–10.3)
Creatinine, Ser: 0.82 mg/dL (ref 0.44–1.00)
Glucose, Bld: 95 mg/dL (ref 65–99)
POTASSIUM: 3.5 mmol/L (ref 3.5–5.1)
SODIUM: 146 mmol/L — AB (ref 135–145)

## 2017-02-19 IMAGING — DX DG ABDOMEN 2V
2 series · 2 of 2 positions shown · non-contrast
Comparison: [DATE]

CLINICAL DATA: Hx of SBO.

EXAM:
ABDOMEN - 2 VIEW

[abdomen erect]
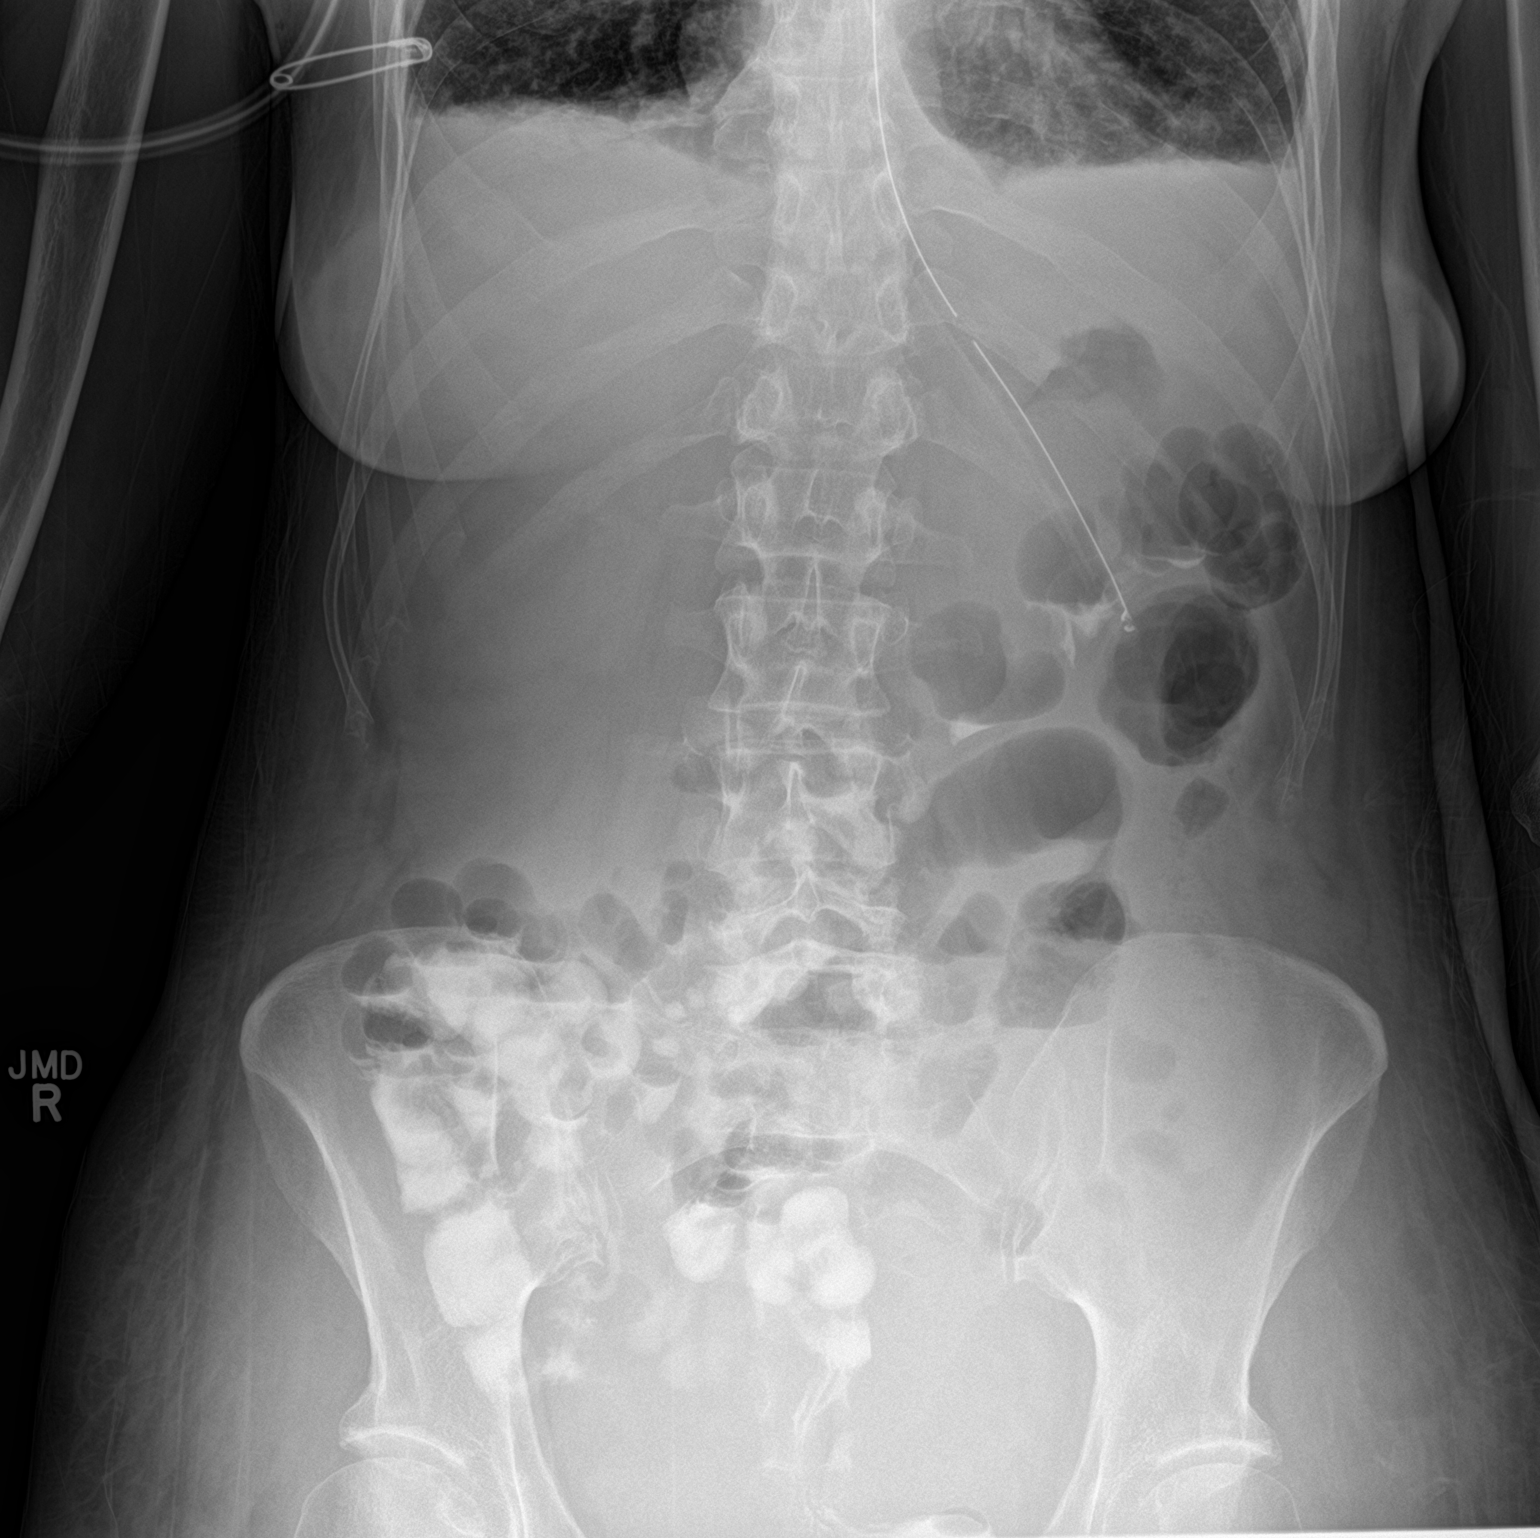

[abdomen supine]
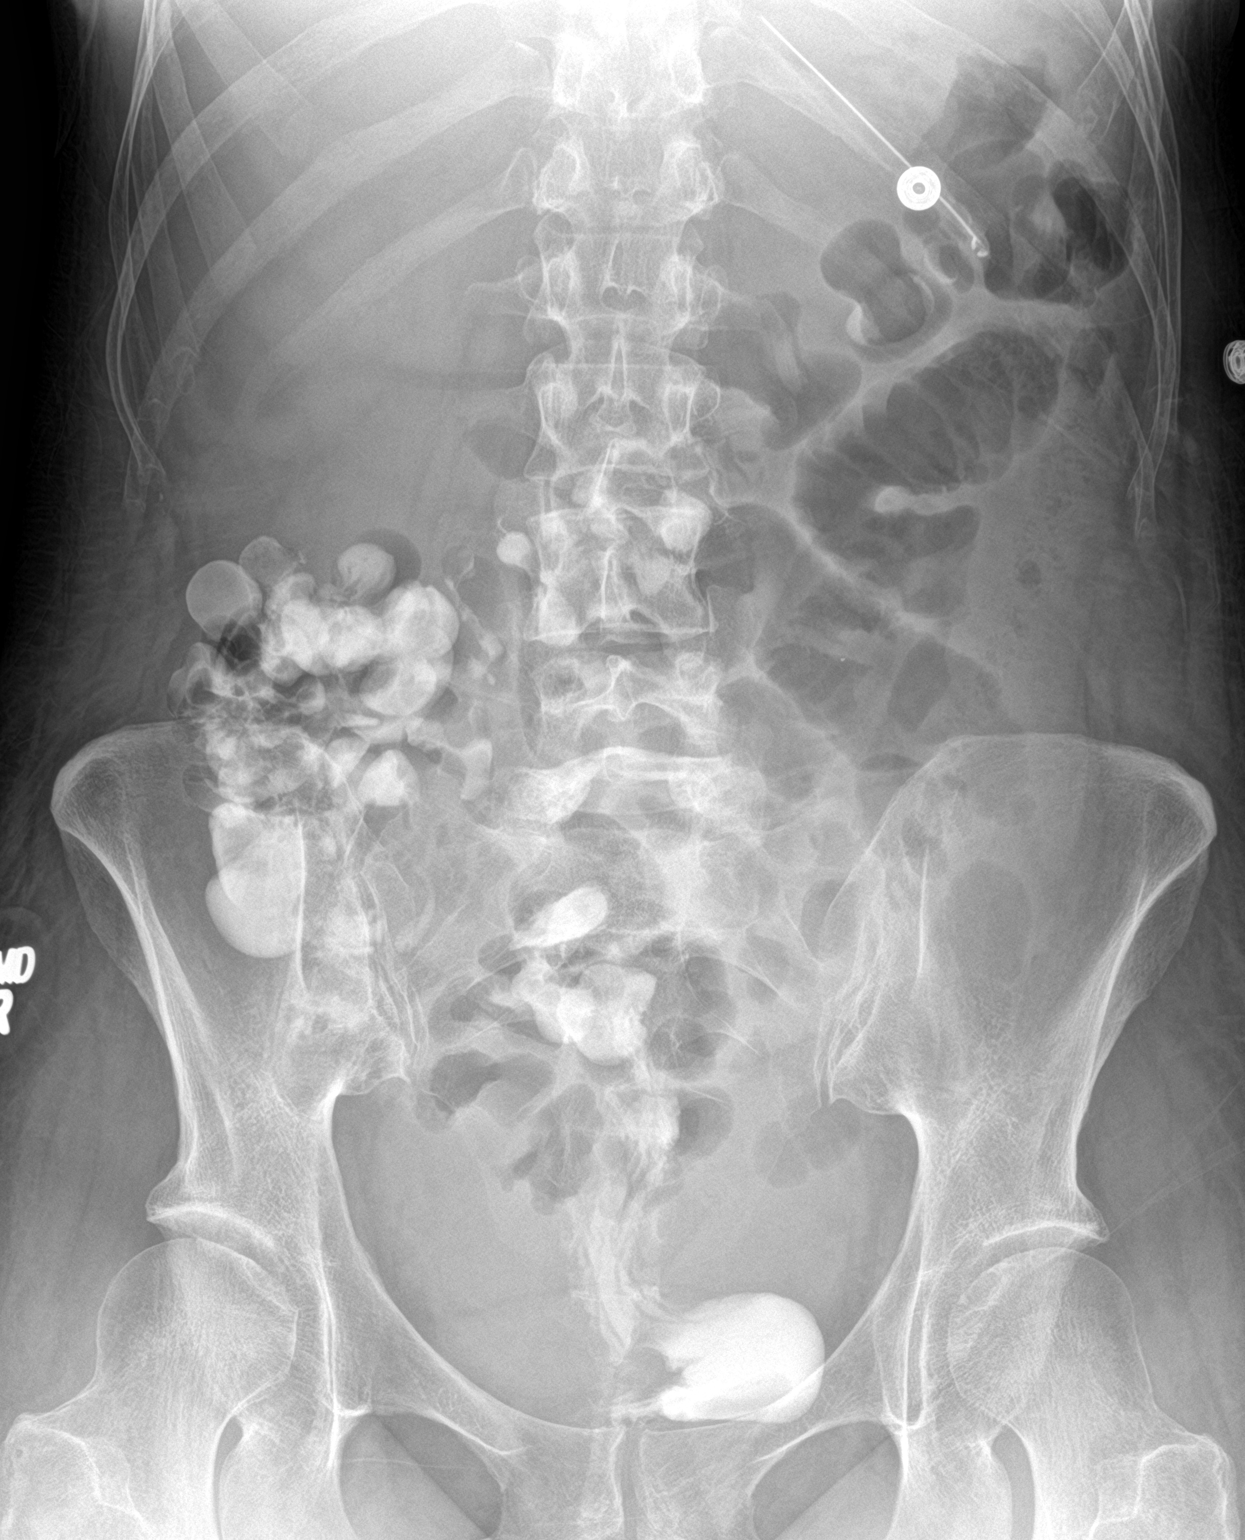

[2 of 2 positions shown; findings below may reference images not displayed]

FINDINGS: Nasogastric tube is in place, tip overlying the level of the
stomach. Contrast is identified within nondilated loops of colon
upon contrast administration. There continued to be mildly dilated
loops of small bowel. Overall small bowel dilatation is improving
compared recent exams.
IMPRESSION: Continued passage of contrast excluding obstruction.

Improving small bowel dilatation.

## 2017-02-19 NOTE — Progress Notes (Signed)
PROGRESS NOTE    Gina Fisher  ZOX:[id number] DOB: [birth year] DOA: 02/14/2017 PCP: [name], MD   Chief Complaint  Patient presents with  . Abdominal Pain  . Emesis    Brief Narrative:  HPI On 02/14/2017 by Dr. Karmen Bongo Gina Fisher is a 51 y.o. female with no significant medical history presenting with apparent viral gastroenteritis.  Husband had a stomach virus over the weekend; they stayed in a hotel room together.  She awoke this AM with mild abdominal pain and nausea. Went to work and about 930 developed more severe abdominal pain and n/v.  Took Zofran and went to sleep for a few hours.  Awoke and had more n/v with severe abdominal pain.  "It just wouldn't go away."  Pain started radiating into her lower back and she just couldn't get relief.  She finally told her husband she had to come to the ER, called 911.  They drove back from [place] yesterday and she felt well at that time.  Interim history Overnight, patient experienced 2 episodes of vomiting. NG tube placed.   Assessment & Plan   Intractable nausea, severe viral gastroenteritis -CT and pelvis showed no acute findings, mid to moderate simple free fluid in the pelvis -Continue conservative management -Antiemetics, IV fluids -Continue pain control with IV morphine  ? Small bowel obstruction versus ileus -Abdominal x-ray today shows mid to distal small bowel obstruction, no evidence of perforation -She has not had a bowel movement since Monday, 02/14/2017 -Gen. surgery consulted and appreciated -Not sure if this is truly small bowel obstruction given CT scan on 02/14/2017 was unremarkable -Repeat Abd xray today: Stable to mild worsening of SBO. No free air -The morning of 6/15, patient experienced 2 episodes of vomiting. NG tube placed. Patient feeling better. Wishes to have NG tube removed today given soreness in her throat.  -General surgery ordering SBO protocol, gastrografin with 8hr delay/abd xray -Patient  had once abdominal surgery about 17 years ago- tubal ligation -Obtained repeat Abd xray, appears to be improving -General surgery will clamp NG tube today and monitor residuals. Patient started on clear liquids today  Sinus bradycardia -Currently asymptomatic, continue to monitor on telemetry -TSH 1.719  Hypotension -Per patient, blood pressure generally runs on the lower side. -Orthostatic vital signs negative, patient remains asymptomatic -Continue IV hydration  Back pain -Continue pain control -CTabd pelvis shows grade 2 anterior listhesis of L5 on S1 due to bilateral L5 spondylolysis  ? Pelvic congestion syndrome -CT abdomen and pelvis showed prominent pelvic/uterine veins and left ovarian vein -Will need outpatient follow-up with her OB/GYN on discharge  Thrombocytopenia -Platelets currently 122, were 156 on admission -Currently not on chemical DVT prophylaxis -Possibly related to viral etiology -Continue to monitor CBC  Normocytic anemia -Hemoglobin upon admission 12.9, currently 10.4. Suspect drop secondary to dilutional component as patient currently receiving IV fluids -Continue to monitor CBC  DVT Prophylaxis  SCDs  Code Status: Full  Family Communication: None at  Bedside, husband via phone  Disposition Plan: Admitted. Continue conservative management for possible SBO. Suspect dispo to home when stable.  Consultants Gen. surgery  Procedures  None  Antibiotics   Anti-infectives    None      Subjective:   Gina Fisher seen and examined today. Patient feels abdominal pain has improved. He feels nausea and vomiting have also improved with placement of NG tube. She wishes to have NG tube removed due to soreness of her throat. With like to drink  fluids today. Does endorse having a bowel movement overnight and this morning. Currently denies chest pain, shortness of breath, dizziness, headache.   Objective:   Vitals:   02/18/17 0422 02/18/17 1451 02/18/17  2055 02/19/17 0434  BP: (!) 105/50 (!) 113/56 (!) 116/56 (!) 112/56  Pulse: (!) 45 (!) 43 (!) 42 (!) 42  Resp: 14 14 14 14   Temp: 98.2 F (36.8 C) 99.1 F (37.3 C) 98.9 F (37.2 C) 98.1 F (36.7 C)  TempSrc: Oral Oral Oral Oral  SpO2: 97% 99% 99% 99%  Weight:      Height:        Intake/Output Summary (Last 24 hours) at 02/19/17 1320 Last data filed at 02/19/17 1049  Gross per 24 hour  Intake          2111.67 ml  Output             1100 ml  Net          1011.67 ml   Filed Weights   02/14/17 1726  Weight: 59 kg (130 lb)   Exam  General: Well developed, well nourished, no apparent distress  HEENT: NCAT, mucous membranes moist. NG tube placed  Cardiovascular: S1 S2 auscultated, RRR, no murmurs  Respiratory: Clear to auscultation bilaterally with equal chest rise  Abdomen: Soft, nontender, nondistended, + bowel sounds  Extremities: warm dry without cyanosis clubbing or edema  Neuro: AAOx3, nonfocals  Psych: Tearful, anxious  Data Reviewed: I have personally reviewed following labs and imaging studies  CBC:  Recent Labs Lab 02/14/17 1729 02/15/17 0449 02/16/17 0417 02/17/17 0508 02/19/17 0400  WBC 8.1 5.7 3.7* 3.6* 4.7  NEUTROABS  --   --  2.7  --   --   HGB 12.9 12.3 10.3* 10.5* 10.4*  HCT 36.8 37.2 31.2* 31.1* 31.4*  MCV 92.9 96.6 95.7 98.1 96.3  PLT 156 150 116* 105* 683*   Basic Metabolic Panel:  Recent Labs Lab 02/14/17 1824 02/15/17 0449 02/16/17 0417 02/17/17 0508 02/19/17 0400  NA 140 139 139 141 146*  K 4.0 3.8 3.7 3.6 3.5  CL 108 105 111 111 117*  CO2 21* 24 21* 19* 22  GLUCOSE 95 94 85 72 95  BUN 14 12 12 16 17   CREATININE 0.87 0.88 0.83 0.88 0.82  CALCIUM 8.8* 8.1* 7.9* 8.0* 8.2*  MG  --   --  1.9  --   --    GFR: Estimated Creatinine Clearance: 78.1 mL/min (by C-G formula based on SCr of 0.82 mg/dL). Liver Function Tests:  Recent Labs Lab 02/14/17 1824 02/16/17 0417 02/17/17 0508  AST 22 18 18   ALT 13* 12* 12*  ALKPHOS  41 34* 31*  BILITOT 1.3* 0.7 0.7  PROT 7.3 5.6* 5.4*  ALBUMIN 4.5 3.3* 3.2*    Recent Labs Lab 02/14/17 1824 02/16/17 0417 02/17/17 0508  LIPASE 29 22 37   No results for input(s): AMMONIA in the last 168 hours. Coagulation Profile: No results for input(s): INR, PROTIME in the last 168 hours. Cardiac Enzymes: No results for input(s): CKTOTAL, CKMB, CKMBINDEX, TROPONINI in the last 168 hours. BNP (last 3 results) No results for input(s): PROBNP in the last 8760 hours. HbA1C: No results for input(s): HGBA1C in the last 72 hours. CBG: No results for input(s): GLUCAP in the last 168 hours. Lipid Profile: No results for input(s): CHOL, HDL, LDLCALC, TRIG, CHOLHDL, LDLDIRECT in the last 72 hours. Thyroid Function Tests:  Recent Labs  02/16/17 1539  TSH 1.719  Anemia Panel: No results for input(s): VITAMINB12, FOLATE, FERRITIN, TIBC, IRON, RETICCTPCT in the last 72 hours. Urine analysis: No results found for: COLORURINE, APPEARANCEUR, LABSPEC, PHURINE, GLUCOSEU, HGBUR, BILIRUBINUR, KETONESUR, PROTEINUR, UROBILINOGEN, NITRITE, LEUKOCYTESUR Sepsis Labs: @LABRCNTIP (procalcitonin:4,lacticidven:4)  )No results found for this or any previous visit (from the past 240 hour(s)).    Radiology Studies: Dg Abd 2 Views  Result Date: 02/19/2017 CLINICAL DATA:  Hx of SBO. EXAM: ABDOMEN - 2 VIEW COMPARISON:  02/18/2017 FINDINGS: Nasogastric tube is in place, tip overlying the level of the stomach. Contrast is identified within nondilated loops of colon upon contrast administration. There continued to be mildly dilated loops of small bowel. Overall small bowel dilatation is improving compared recent exams. IMPRESSION: Continued passage of contrast excluding obstruction. Improving small bowel dilatation. Electronically Signed   By: Nolon Nations M.D.   On: 02/19/2017 12:23   Dg Abd Portable 1v-small Bowel Obstruction Protocol-initial, 8 Hr Delay  Result Date: 02/18/2017 CLINICAL DATA:   Small bowel protocol 8 hour delay EXAM: PORTABLE ABDOMEN - 1 VIEW COMPARISON:  02/18/2017 FINDINGS: Esophageal tube tip is in the left upper quadrant. Persistent dilated loops of small bowel in the central abdomen measuring up to 3.3 cm. Majority of the contrast is in the colon. IMPRESSION: 1. Continue dilatation of central small bowel loops, slightly improved since 02/17/2017. 2. There is contrast present within the colon and rectum. Electronically Signed   By: Donavan Foil M.D.   On: 02/18/2017 22:14   Dg Abd Portable 1v  Result Date: 02/18/2017 CLINICAL DATA:  51 year old female status post NG tube placement. EXAM: PORTABLE ABDOMEN - 1 VIEW COMPARISON:  02/17/2017 and earlier. FINDINGS: Portable AP supine view at 0759 hours. Enteric tube courses to the left upper quadrant, tip projects over the gastric body. Side hole is present at the level of the gastric cardia. Stable lung bases. Continued gas-filled dilated mid abdominal small bowel loops with a paucity of large bowel gas, although the loops appear mildly less distended since yesterday (now 38 mm diameter). Stable visualized osseous structures. IMPRESSION: 1. NG tube placed terminating in the stomach. Recommend tube advancement of 5 cm to ensure side hole placement within the stomach. 2. Continued small bowel obstruction. Electronically Signed   By: Genevie Ann M.D.   On: 02/18/2017 08:25     Scheduled Meds: . metoCLOPramide (REGLAN) injection  5 mg Intravenous Q8H  . pantoprazole (PROTONIX) IV  40 mg Intravenous Q24H   Continuous Infusions: . sodium chloride 100 mL/hr at 02/19/17 1041     LOS: 4 days   Time Spent in minutes   35 minutes  Arletha Marschke D.O. on 02/19/2017 at 1:20 PM  Between 7am to 7pm - Pager - [phone]  After 7pm go to [email or web address] - password [email or web address]  And look for the night coverage person covering for me after hours  [facility] Office  [phone]

## 2017-02-19 NOTE — Progress Notes (Signed)
[facility]/Trauma Progress Note      Subjective:  CC: Abdominal pain and bloating  Pt having bowel function and some abd pain.  C/O sore throat  Objective: Vital signs in last 24 hours: Temp:  [98.1 F (36.7 C)-99.1 F (37.3 C)] 98.1 F (36.7 C) (06/16 0434) Pulse Rate:  [42-43] 42 (06/16 0434) Resp:  [14] 14 (06/16 0434) BP: (112-116)/(56) 112/56 (06/16 0434) SpO2:  [99 %] 99 % (06/16 0434) Last BM Date: 02/18/17  Intake/Output from previous day: 06/15 0701 - 06/16 0700 In: 2460 [P.O.:60; I.V.:2400] Out: 1650 [Urine:150; Emesis/NG output:1500] Intake/Output this shift: Total I/O In: -  Out: 150 [Emesis/NG output:150]  PE: Gen:  Alert, NAD, pleasant, cooperative, well-appearing Pulm:  Rate and effort normal Abd: Soft, nondistended, mild generalized TTP, no signs of peritonitis, no HSM Skin: no rashes noted, warm and dry  Lab Results:   Recent Labs  02/17/17 0508 02/19/17 0400  WBC 3.6* 4.7  HGB 10.5* 10.4*  HCT 31.1* 31.4*  PLT 105* 122*   BMET  Recent Labs  02/17/17 0508 02/19/17 0400  NA 141 146*  K 3.6 3.5  CL 111 117*  CO2 19* 22  GLUCOSE 72 95  BUN 16 17  CREATININE 0.88 0.82  CALCIUM 8.0* 8.2*   PT/INR No results for input(s): LABPROT, INR in the last 72 hours. CMP     Component Value Date/Time   NA 146 (H) 02/19/2017 0400   K 3.5 02/19/2017 0400   CL 117 (H) 02/19/2017 0400   CO2 22 02/19/2017 0400   GLUCOSE 95 02/19/2017 0400   BUN 17 02/19/2017 0400   CREATININE 0.82 02/19/2017 0400   CALCIUM 8.2 (L) 02/19/2017 0400   PROT 5.4 (L) 02/17/2017 0508   ALBUMIN 3.2 (L) 02/17/2017 0508   AST 18 02/17/2017 0508   ALT 12 (L) 02/17/2017 0508   ALKPHOS 31 (L) 02/17/2017 0508   BILITOT 0.7 02/17/2017 0508   GFRNONAA >60 02/19/2017 0400   GFRAA >60 02/19/2017 0400   Lipase     Component Value Date/Time   LIPASE 37 02/17/2017 0508    Studies/Results: Dg Abd Portable 1v-small Bowel Obstruction Protocol-initial, 8 Hr  Delay  Result Date: 02/18/2017 CLINICAL DATA:  Small bowel protocol 8 hour delay EXAM: PORTABLE ABDOMEN - 1 VIEW COMPARISON:  02/18/2017 FINDINGS: Esophageal tube tip is in the left upper quadrant. Persistent dilated loops of small bowel in the central abdomen measuring up to 3.3 cm. Majority of the contrast is in the colon. IMPRESSION: 1. Continue dilatation of central small bowel loops, slightly improved since 02/17/2017. 2. There is contrast present within the colon and rectum. Electronically Signed   By: Donavan Foil M.D.   On: 02/18/2017 22:14   Dg Abd Portable 1v  Result Date: 02/18/2017 CLINICAL DATA:  51 year old female status post NG tube placement. EXAM: PORTABLE ABDOMEN - 1 VIEW COMPARISON:  02/17/2017 and earlier. FINDINGS: Portable AP supine view at 0759 hours. Enteric tube courses to the left upper quadrant, tip projects over the gastric body. Side hole is present at the level of the gastric cardia. Stable lung bases. Continued gas-filled dilated mid abdominal small bowel loops with a paucity of large bowel gas, although the loops appear mildly less distended since yesterday (now 38 mm diameter). Stable visualized osseous structures. IMPRESSION: 1. NG tube placed terminating in the stomach. Recommend tube advancement of 5 cm to ensure side hole placement within the stomach. 2. Continued small bowel obstruction. Electronically Signed   By: Lemmie Evens  [place] Crane M.D.   On: 02/18/2017 08:25    Anti-infectives: Anti-infectives    None       Assessment/Plan Gastroenteritis vs ileus vs SBO Hx of tubal ligation - plain films show contrast in colon but she is having high NG output - Clamp NGT - SBO protocol ok  Plan: Clamp NG and d/c if residuals ok after 6 hrs   LOS: 4 days    Rosario Adie, MD  [facility]

## 2017-02-20 DIAGNOSIS — E876 Hypokalemia: Secondary | ICD-10-CM

## 2017-02-20 LAB — CBC
HEMATOCRIT: 30.6 % — AB (ref 36.0–46.0)
HEMOGLOBIN: 10.3 g/dL — AB (ref 12.0–15.0)
MCH: 32.5 pg (ref 26.0–34.0)
MCHC: 33.7 g/dL (ref 30.0–36.0)
MCV: 96.5 fL (ref 78.0–100.0)
PLATELETS: 129 10*3/uL — AB (ref 150–400)
RBC: 3.17 MIL/uL — AB (ref 3.87–5.11)
RDW: 13.1 % (ref 11.5–15.5)
WBC: 3.7 10*3/uL — ABNORMAL LOW (ref 4.0–10.5)

## 2017-02-20 LAB — BASIC METABOLIC PANEL
Anion gap: 6 (ref 5–15)
BUN: 14 mg/dL (ref 6–20)
CHLORIDE: 112 mmol/L — AB (ref 101–111)
CO2: 24 mmol/L (ref 22–32)
Calcium: 8 mg/dL — ABNORMAL LOW (ref 8.9–10.3)
Creatinine, Ser: 0.78 mg/dL (ref 0.44–1.00)
GFR calc Af Amer: >60 mL/min (ref >60)
GFR calc non Af Amer: >60 mL/min (ref >60)
GLUCOSE: 101 mg/dL — AB (ref 65–99)
POTASSIUM: 2.8 mmol/L — AB (ref 3.5–5.1)
Sodium: 142 mmol/L (ref 135–145)

## 2017-02-20 LAB — MAGNESIUM: Magnesium: 1.7 mg/dL (ref 1.7–2.4)

## 2017-02-20 MED ORDER — MAGNESIUM SULFATE 2 GM/50ML IV SOLN
2.0000 g | Freq: Once | INTRAVENOUS | Status: AC
Start: 2017-02-20 — End: 2017-02-20
  Administered 2017-02-20: 2 g via INTRAVENOUS
  Filled 2017-02-20: qty 50

## 2017-02-20 MED ORDER — POTASSIUM CHLORIDE CRYS ER 20 MEQ PO TBCR
40.0000 meq | EXTENDED_RELEASE_TABLET | Freq: Once | ORAL | Status: AC
  Administered 2017-02-20: 40 meq via ORAL
  Filled 2017-02-20: qty 2

## 2017-02-20 MED ORDER — MORPHINE SULFATE (PF) 10 MG/ML IV SOLN: 2.0000 mg | INTRAVENOUS | Status: DC | PRN

## 2017-02-20 MED ORDER — SODIUM CHLORIDE 0.9 % IV SOLN
INTRAVENOUS | Status: AC
  Administered 2017-02-20: 08:00:00 via INTRAVENOUS
  Filled 2017-02-20: qty 1000

## 2017-02-20 NOTE — Progress Notes (Signed)
[facility]/Trauma Progress Note      Subjective:  CC: Abdominal pain and bloating  Pt having bowel function and less abd pain.  NG out and tolerating clears  Objective: Vital signs in last 24 hours: Temp:  [97.9 F (36.6 C)-99.1 F (37.3 C)] 98.3 F (36.8 C) (06/17 0509) Pulse Rate:  [39-49] 49 (06/17 0509) Resp:  [13-14] 13 (06/17 0509) BP: (110-128)/(62-71) 110/71 (06/17 0509) SpO2:  [95 %-98 %] 95 % (06/17 0509) Last BM Date: 02/19/17  Intake/Output from previous day: 06/16 0701 - 06/17 0700 In: 8182 [P.O.:930; I.V.:800] Out: 150 [Emesis/NG output:150] Intake/Output this shift: Total I/O In: 240 [P.O.:240] Out: -   PE: Gen:  Alert, NAD, pleasant, cooperative, well-appearing Pulm:  Rate and effort normal Abd: Soft, nondistended, min generalized TTP, no signs of peritonitis Skin: no rashes noted, warm and dry  Lab Results:   Recent Labs  02/19/17 0400 02/20/17 0448  WBC 4.7 3.7*  HGB 10.4* 10.3*  HCT 31.4* 30.6*  PLT 122* 129*   BMET  Recent Labs  02/19/17 0400 02/20/17 0448  NA 146* 142  K 3.5 2.8*  CL 117* 112*  CO2 22 24  GLUCOSE 95 101*  BUN 17 14  CREATININE 0.82 0.78  CALCIUM 8.2* 8.0*   PT/INR No results for input(s): LABPROT, INR in the last 72 hours. CMP     Component Value Date/Time   NA 142 02/20/2017 0448   K 2.8 (L) 02/20/2017 0448   CL 112 (H) 02/20/2017 0448   CO2 24 02/20/2017 0448   GLUCOSE 101 (H) 02/20/2017 0448   BUN 14 02/20/2017 0448   CREATININE 0.78 02/20/2017 0448   CALCIUM 8.0 (L) 02/20/2017 0448   PROT 5.4 (L) 02/17/2017 0508   ALBUMIN 3.2 (L) 02/17/2017 0508   AST 18 02/17/2017 0508   ALT 12 (L) 02/17/2017 0508   ALKPHOS 31 (L) 02/17/2017 0508   BILITOT 0.7 02/17/2017 0508   GFRNONAA >60 02/20/2017 0448   GFRAA >60 02/20/2017 0448   Lipase     Component Value Date/Time   LIPASE 37 02/17/2017 0508    Studies/Results: Dg Abd 2 Views  Result Date: 02/19/2017 CLINICAL DATA:  Hx of  SBO. EXAM: ABDOMEN - 2 VIEW COMPARISON:  02/18/2017 FINDINGS: Nasogastric tube is in place, tip overlying the level of the stomach. Contrast is identified within nondilated loops of colon upon contrast administration. There continued to be mildly dilated loops of small bowel. Overall small bowel dilatation is improving compared recent exams. IMPRESSION: Continued passage of contrast excluding obstruction. Improving small bowel dilatation. Electronically Signed   By: Nolon Nations M.D.   On: 02/19/2017 12:23   Dg Abd Portable 1v-small Bowel Obstruction Protocol-initial, 8 Hr Delay  Result Date: 02/18/2017 CLINICAL DATA:  Small bowel protocol 8 hour delay EXAM: PORTABLE ABDOMEN - 1 VIEW COMPARISON:  02/18/2017 FINDINGS: Esophageal tube tip is in the left upper quadrant. Persistent dilated loops of small bowel in the central abdomen measuring up to 3.3 cm. Majority of the contrast is in the colon. IMPRESSION: 1. Continue dilatation of central small bowel loops, slightly improved since 02/17/2017. 2. There is contrast present within the colon and rectum. Electronically Signed   By: Donavan Foil M.D.   On: 02/18/2017 22:14    Anti-infectives: Anti-infectives    None       Assessment/Plan Gastroenteritis vs ileus vs SBO Hx of tubal ligation - passed SB protocol and NG clamping trial Hypokalemia- due to GI losses, being replaced by  primary MD  Plan: - advance diet as tolerate   LOS: 5 days    Rosario Adie, MD  [facility]

## 2017-02-20 NOTE — Progress Notes (Signed)
PROGRESS NOTE    Gina Fisher  QQP:[id number] DOB: 10-30-1965 DOA: 02/14/2017 PCP: [name], MD   Chief Complaint  Patient presents with  . Abdominal Pain  . Emesis    Brief Narrative:  HPI On 02/14/2017 by Dr. Karmen Bongo Gina Fisher is a 51 y.o. female with no significant medical history presenting with apparent viral gastroenteritis.  Husband had a stomach virus over the weekend; they stayed in a hotel room together.  She awoke this AM with mild abdominal pain and nausea. Went to work and about 930 developed more severe abdominal pain and n/v.  Took Zofran and went to sleep for a few hours.  Awoke and had more n/v with severe abdominal pain.  "It just wouldn't go away."  Pain started radiating into her lower back and she just couldn't get relief.  She finally told her husband she had to come to the ER, called 911.  They drove back from [place] yesterday and she felt well at that time.  Interim history Overnight, patient experienced 2 episodes of vomiting. NG tube placed. Patient improved, NG removed on 6/16. On clear liquids, and being advanced  Assessment & Plan   Intractable nausea, severe viral gastroenteritis -CT and pelvis showed no acute findings, mid to moderate simple free fluid in the pelvis -Continue conservative management -Antiemetics, IV fluids -Continue pain control with IV morphine  ? Small bowel obstruction versus ileus -Abdominal x-ray today shows mid to distal small bowel obstruction, no evidence of perforation -She has not had a bowel movement since Monday, 02/14/2017 -Gen. surgery consulted and appreciated -Not sure if this is truly small bowel obstruction given CT scan on 02/14/2017 was unremarkable -Repeat Abd xray today: Stable to mild worsening of SBO. No free air -The morning of 6/15, patient experienced 2 episodes of vomiting. NG tube placed. Patient feeling better. Wishes to have NG tube removed today given soreness in her throat.  -General  surgery ordering SBO protocol, gastrografin with 8hr delay/abd xray -Patient had once abdominal surgery about 17 years ago- tubal ligation -Obtained repeat Abd xray on 6/16, appears to be improving -NG tube removed.  -Patient tolerated clear liquid diet and advanced to full liquids today  Sinus bradycardia -Currently asymptomatic, continue to monitor on telemetry -TSH 1.719  Hypotension -Per patient, blood pressure generally runs on the lower side. -Orthostatic vital signs negative, patient remains asymptomatic -Continue IV hydration  Back pain -Continue pain control -CTabd pelvis shows grade 2 anterior listhesis of L5 on S1 due to bilateral L5 spondylolysis  ? Pelvic congestion syndrome -CT abdomen and pelvis showed prominent pelvic/uterine veins and left ovarian vein -Will need outpatient follow-up with her OB/GYN on discharge  Thrombocytopenia -Platelets currently 122, were 156 on admission -Currently not on chemical DVT prophylaxis -Possibly related to viral etiology -Continue to monitor CBC  Normocytic anemia -Hemoglobin upon admission 12.9, currently 10.3. Suspect drop secondary to dilutional component as patient currently receiving IV fluids -Continue to monitor CBC  Hypokalemia -Likely secondary to GI losses -Potassium down at 2.8 -Will replace IV as well as orally -Obtain magnesium level, currently 1.7, will also supplement -Continue to monitor BMP  DVT Prophylaxis  SCDs  Code Status: Full  Family Communication: None at  Bedside  Disposition Plan: Admitted. Patient improving, advancing diet. Possibly discharge to home on 02/21/2017  Consultants Gen. surgery  Procedures  None  Antibiotics   Anti-infectives    None      Subjective:   Gina Fisher seen and examined  today. Feels abdominal pain has improved. No further nausea or vomiting. Patient able to have loose bowel movements. Able to tolerate liquids. Denies chest pain, shortness of breath,  headache, dizziness.   Objective:   Vitals:   02/19/17 0434 02/19/17 1422 02/19/17 2241 02/20/17 0509  BP: (!) 112/56 128/62 111/67 110/71  Pulse: (!) 42 (!) 39 (!) 46 (!) 49  Resp: 14 14 13 13   Temp: 98.1 F (36.7 C) 97.9 F (36.6 C) 99.1 F (37.3 C) 98.3 F (36.8 C)  TempSrc: Oral Oral Oral Oral  SpO2: 99% 98% 98% 95%  Weight:      Height:        Intake/Output Summary (Last 24 hours) at 02/20/17 1228 Last data filed at 02/20/17 0900  Gross per 24 hour  Intake             1510 ml  Output                0 ml  Net             1510 ml   Filed Weights   02/14/17 1726  Weight: 59 kg (130 lb)   Exam  General: Well developed, well nourished, NAD, appears stated age  HEENT: NCAT, mucous membranes moist.   Cardiovascular: S1 S2 auscultated, bradycardic, RRR  Respiratory: Clear to auscultation bilaterally with equal chest rise  Abdomen: Soft, mildly tender, nondistended, + bowel sounds  Extremities: warm dry without cyanosis clubbing or edema  Neuro: AAOx3, nonfocal  Psych: Normal affect and demeanor with intact judgement and insight, pleasant  Data Reviewed: I have personally reviewed following labs and imaging studies  CBC:  Recent Labs Lab 02/15/17 0449 02/16/17 0417 02/17/17 0508 02/19/17 0400 02/20/17 0448  WBC 5.7 3.7* 3.6* 4.7 3.7*  NEUTROABS  --  2.7  --   --   --   HGB 12.3 10.3* 10.5* 10.4* 10.3*  HCT 37.2 31.2* 31.1* 31.4* 30.6*  MCV 96.6 95.7 98.1 96.3 96.5  PLT 150 116* 105* 122* 106*   Basic Metabolic Panel:  Recent Labs Lab 02/15/17 0449 02/16/17 0417 02/17/17 0508 02/19/17 0400 02/20/17 0448  NA 139 139 141 146* 142  K 3.8 3.7 3.6 3.5 2.8*  CL 105 111 111 117* 112*  CO2 24 21* 19* 22 24  GLUCOSE 94 85 72 95 101*  BUN 12 12 16 17 14   CREATININE 0.88 0.83 0.88 0.82 0.78  CALCIUM 8.1* 7.9* 8.0* 8.2* 8.0*  MG  --  1.9  --   --  1.7   GFR: Estimated Creatinine Clearance: 80.1 mL/min (by C-G formula based on SCr of 0.78  mg/dL). Liver Function Tests:  Recent Labs Lab 02/14/17 1824 02/16/17 0417 02/17/17 0508  AST 22 18 18   ALT 13* 12* 12*  ALKPHOS 41 34* 31*  BILITOT 1.3* 0.7 0.7  PROT 7.3 5.6* 5.4*  ALBUMIN 4.5 3.3* 3.2*    Recent Labs Lab 02/14/17 1824 02/16/17 0417 02/17/17 0508  LIPASE 29 22 37   No results for input(s): AMMONIA in the last 168 hours. Coagulation Profile: No results for input(s): INR, PROTIME in the last 168 hours. Cardiac Enzymes: No results for input(s): CKTOTAL, CKMB, CKMBINDEX, TROPONINI in the last 168 hours. BNP (last 3 results) No results for input(s): PROBNP in the last 8760 hours. HbA1C: No results for input(s): HGBA1C in the last 72 hours. CBG: No results for input(s): GLUCAP in the last 168 hours. Lipid Profile: No results for input(s): CHOL, HDL, LDLCALC, TRIG,  CHOLHDL, LDLDIRECT in the last 72 hours. Thyroid Function Tests: No results for input(s): TSH, T4TOTAL, FREET4, T3FREE, THYROIDAB in the last 72 hours. Anemia Panel: No results for input(s): VITAMINB12, FOLATE, FERRITIN, TIBC, IRON, RETICCTPCT in the last 72 hours. Urine analysis: No results found for: COLORURINE, APPEARANCEUR, LABSPEC, PHURINE, GLUCOSEU, HGBUR, BILIRUBINUR, KETONESUR, PROTEINUR, UROBILINOGEN, NITRITE, LEUKOCYTESUR Sepsis Labs: @LABRCNTIP (procalcitonin:4,lacticidven:4)  )No results found for this or any previous visit (from the past 240 hour(s)).    Radiology Studies: Dg Abd 2 Views  Result Date: 02/19/2017 CLINICAL DATA:  Hx of SBO. EXAM: ABDOMEN - 2 VIEW COMPARISON:  02/18/2017 FINDINGS: Nasogastric tube is in place, tip overlying the level of the stomach. Contrast is identified within nondilated loops of colon upon contrast administration. There continued to be mildly dilated loops of small bowel. Overall small bowel dilatation is improving compared recent exams. IMPRESSION: Continued passage of contrast excluding obstruction. Improving small bowel dilatation.  Electronically Signed   By: Nolon Nations M.D.   On: 02/19/2017 12:23   Dg Abd Portable 1v-small Bowel Obstruction Protocol-initial, 8 Hr Delay  Result Date: 02/18/2017 CLINICAL DATA:  Small bowel protocol 8 hour delay EXAM: PORTABLE ABDOMEN - 1 VIEW COMPARISON:  02/18/2017 FINDINGS: Esophageal tube tip is in the left upper quadrant. Persistent dilated loops of small bowel in the central abdomen measuring up to 3.3 cm. Majority of the contrast is in the colon. IMPRESSION: 1. Continue dilatation of central small bowel loops, slightly improved since 02/17/2017. 2. There is contrast present within the colon and rectum. Electronically Signed   By: Donavan Foil M.D.   On: 02/18/2017 22:14     Scheduled Meds: . metoCLOPramide (REGLAN) injection  5 mg Intravenous Q8H  . pantoprazole (PROTONIX) IV  40 mg Intravenous Q24H   Continuous Infusions: . sodium chloride 0.9 % 1,000 mL with potassium chloride 80 mEq infusion 100 mL/hr at 02/20/17 0829     LOS: 5 days   Time Spent in minutes   35 minutes  Joseeduardo Brix D.O. on 02/20/2017 at 12:28 PM  Between 7am to 7pm - Pager - [phone]  After 7pm go to [email or web address] - password [email or web address]  And look for the night coverage person covering for me after hours  [facility] Office  [phone]

## 2017-02-21 LAB — CBC
HCT: 28.8 % — ABNORMAL LOW (ref 36.0–46.0)
HEMOGLOBIN: 9.6 g/dL — AB (ref 12.0–15.0)
MCH: 31.2 pg (ref 26.0–34.0)
MCHC: 33.3 g/dL (ref 30.0–36.0)
MCV: 93.5 fL (ref 78.0–100.0)
Platelets: 118 10*3/uL — ABNORMAL LOW (ref 150–400)
RBC: 3.08 MIL/uL — AB (ref 3.87–5.11)
RDW: 12.8 % (ref 11.5–15.5)
WBC: 3.3 10*3/uL — AB (ref 4.0–10.5)

## 2017-02-21 LAB — BASIC METABOLIC PANEL
ANION GAP: 3 — AB (ref 5–15)
BUN: 8 mg/dL (ref 6–20)
CHLORIDE: 115 mmol/L — AB (ref 101–111)
CO2: 22 mmol/L (ref 22–32)
Calcium: 7.9 mg/dL — ABNORMAL LOW (ref 8.9–10.3)
Creatinine, Ser: 0.63 mg/dL (ref 0.44–1.00)
GFR calc Af Amer: >60 mL/min (ref >60)
Glucose, Bld: 97 mg/dL (ref 65–99)
POTASSIUM: 3.5 mmol/L (ref 3.5–5.1)
SODIUM: 140 mmol/L (ref 135–145)

## 2017-02-21 MED ORDER — PANTOPRAZOLE SODIUM 40 MG PO TBEC
40.0000 mg | DELAYED_RELEASE_TABLET | Freq: Every day | ORAL | Status: DC
  Administered 2017-02-21: 40 mg via ORAL
  Filled 2017-02-21: qty 1

## 2017-02-21 MED ORDER — ONDANSETRON HCL 4 MG PO TABS: 4.0000 mg | ORAL_TABLET | Freq: Four times a day (QID) | ORAL | 0 refills | Status: AC | PRN

## 2017-02-21 MED ORDER — POTASSIUM CHLORIDE CRYS ER 20 MEQ PO TBCR
40.0000 meq | EXTENDED_RELEASE_TABLET | Freq: Once | ORAL | Status: AC
Start: 2017-02-21 — End: 2017-02-21
  Administered 2017-02-21: 40 meq via ORAL
  Filled 2017-02-21: qty 2

## 2017-02-21 MED ORDER — PANTOPRAZOLE SODIUM 40 MG PO TBEC: 40.0000 mg | DELAYED_RELEASE_TABLET | Freq: Every day | ORAL | 0 refills | Status: AC

## 2017-02-21 NOTE — Progress Notes (Signed)
Pt tolerating diet and no complaints of pain or nausea.  D/C instructions were given and understanding was verbalized.

## 2017-02-21 NOTE — Discharge Summary (Signed)
Physician Discharge Summary  Gina Fisher JEH:[id number] DOB: 1966/07/25 DOA: 02/14/2017  PCP: [name], MD  Admit date: 02/14/2017 Discharge date: 02/21/2017  Time spent: 45 minutes  Recommendations for Outpatient Follow-up:  Patient will be discharged to home.  Patient will need to follow up with primary care provider within one week of discharge, repeat CBC and BMP.  Patient should continue medications as prescribed.  Patient should follow a soft diet.   Discharge Diagnoses:  Intractable nausea, severe viral gastroenteritis ? Small bowel obstruction versus ileus Sinus bradycardia Hypotension Back pain ? Pelvic congestion syndrome Thrombocytopenia Normocytic anemia Hypokalemia  Discharge Condition: Stable  Diet recommendation: soft  Filed Weights   02/14/17 1726  Weight: 59 kg (130 lb)    History of present illness:  On 02/14/2017 by Dr. Estill Bakes Welchis a 51 y.o.femalewith no significant medical history presenting with apparent viral gastroenteritis. Husband had a stomach virus over the weekend; they stayed in a hotel room together. She awoke this AM with mild abdominal pain and nausea. Went to work and about 930 developed more severe abdominal pain and n/v. Took Zofran and went to sleep for a few hours. Awoke and had more n/v with severe abdominal pain. "It just wouldn't go away." Pain started radiating into her lower back and she just couldn't get relief. She finally told her husband she had to come to the ER, called 911. They drove back from [place] yesterday and she felt well at that time.  Hospital Course:  Intractable nausea, severe viral gastroenteritis -CT and pelvis showed no acute findings, mid to moderate simple free fluid in the pelvis -Continue conservative management -Improved -Will discharge with zofran  ? Small bowel obstruction versus ileus -Abdominal x-ray today shows mid to distal small bowel obstruction, no evidence of  perforation -She has not had a bowel movement since Monday, 02/14/2017 -Gen. surgery consulted and appreciated -Not sure if this is truly small bowel obstruction given CT scan on 02/14/2017 was unremarkable -Repeat Abd xray today: Stable to mild worsening of SBO. No free air -The morning of 6/15, patient experienced 2 episodes of vomiting. NG tube placed. Patient feeling better. Wishes to have NG tube removed today given soreness in her throat.  -General surgery ordering SBO protocol, gastrografin with 8hr delay/abd xray -Patient had once abdominal surgery about 17 years ago- tubal ligation -Obtained repeat Abd xray on 6/16, appears to be improving -NG tube removed.  -patient placed on soft diet and tolerated well  Sinus bradycardia -Currently asymptomatic, continue to monitor on telemetry -TSH 1.719  Hypotension -Per patient, blood pressure generally runs on the lower side. -Orthostatic vital signs negative, patient remains asymptomatic  Back pain -Improved -CTabd pelvis shows grade 2 anterior listhesis of L5 on S1 due to bilateral L5 spondylolysis  ? Pelvic congestion syndrome -CT abdomen and pelvis showed prominent pelvic/uterine veins and left ovarian vein -Will need outpatient follow-up with her OB/GYN on discharge  Thrombocytopenia -Platelets currently 118, were 156 on admission -Currently not on chemical DVT prophylaxis -Possibly related to viral etiology  Normocytic anemia -Hemoglobin upon admission 12.9, currently 9.6. Suspect drop secondary to dilutional component as patient currently receiving IV fluids  Hypokalemia -Likely secondary to GI losses -Potassium replaced, currently 3.5 -magnesium 1.7, supplemented -Repeat BMP  Procedures: None  Consultations: General surgery  Discharge Exam: Vitals:   02/20/17 2154 02/21/17 0542  BP: 120/63 115/73  Pulse: (!) 45 (!) 45  Resp: 12 13  Temp: 98.8 F (37.1 C) 98.5 F (  36.9 C)   Patient feeling better.  Feels mildly bloated today, however, no abdominal pain, nausea, vomiting. Denies chest pain, shortness of breath, dizziness, headache.    General: Well developed, well nourished, NAD, appears stated age  HEENT: NCAT, mucous membranes moist.  Cardiovascular: S1 S2 auscultated, no rubs, murmurs or gallops. Regular rate and rhythm.  Respiratory: Clear to auscultation bilaterally with equal chest rise, no wheezing  Abdomen: Soft, nontender, nondistended, + bowel sounds  Extremities: warm dry without cyanosis clubbing or edema  Neuro: AAOx3, nonfocal  Psych: Normal affect and demeanor with intact judgement and insight, pleasant  Discharge Instructions Discharge Instructions    Discharge instructions    Complete by:  As directed    Patient will be discharged to home.  Patient will need to follow up with primary care provider within one week of discharge, repeat CBC and BMP.  Patient should continue medications as prescribed.  Patient should follow a soft diet.     Current Discharge Medication List    START taking these medications   Details  ondansetron (ZOFRAN) 4 MG tablet Take 1 tablet (4 mg total) by mouth every 6 (six) hours as needed for nausea. Qty: 30 tablet, Refills: 0    pantoprazole (PROTONIX) 40 MG tablet Take 1 tablet (40 mg total) by mouth daily. Qty: 30 tablet, Refills: 0       No Known Allergies Follow-up Information    [name], MD. Schedule an appointment as soon as possible for a visit in 1 week(s).   Specialty:  Family Medicine Why:  Hospital follow up Contact information: [address] [place] [address] [phone]            The results of significant diagnostics from this hospitalization (including imaging, microbiology, ancillary and laboratory) are listed below for reference.    Significant Diagnostic Studies: Ct Abdomen Pelvis W Contrast  Result Date: 02/14/2017 CLINICAL DATA:  Mid to lower abdominal pain with  nausea and vomiting 1-2 days. EXAM: CT ABDOMEN AND PELVIS WITH CONTRAST TECHNIQUE: Multidetector CT imaging of the abdomen and pelvis was performed using the standard protocol following bolus administration of intravenous contrast. CONTRAST:  149mL ISOVUE-300 IOPAMIDOL (ISOVUE-300) INJECTION 61% COMPARISON:  None. FINDINGS: Lower chest: Lung bases are normal. Hepatobiliary: Subcentimeter hypodensity over the right lobe of the liver too small to characterize but likely a cyst. Gallbladder and biliary tree are within normal. Pancreas: Within normal. Spleen: Within normal. Adrenals/Urinary Tract: Adrenal glands are normal. Kidneys are normal size without hydronephrosis or nephrolithiasis. Ureters and bladder are normal. Stomach/Bowel: Stomach is within normal. There are multiple fluid-filled prominent but nondilated mid to distal small bowel loops. Appendix is difficult to visualize but likely within normal. Colon is unremarkable. Vascular/Lymphatic: Arterial structures are within normal. There are prominent pelvic/periuterine veins and prominent left ovarian/gonadal vein. No evidence of adenopathy. Reproductive: Uterus and ovaries are unremarkable. There is mild to moderate free fluid in the pelvis which is simple in nature. Other: No evidence of focal inflammatory change or free peritoneal air. Musculoskeletal: Degenerative change with disc disease at the L5-S1 level. Grade 2 anterolisthesis of L5 on S1 due to bilateral L5 spondylolysis. IMPRESSION: No acute findings in the abdomen/ pelvis. Mild to moderate simple free fluid in the pelvis. Prominent pelvic/ periuterine veins and left ovarian vein which can be seen with pelvic congestion syndrome. Subcentimeter right liver hypodensity too small to characterize but likely a cyst. Grade 2 anterolisthesis of L5 on S1 due to bilateral L5  spondylolysis. Electronically Signed   By: Marin Olp M.D.   On: 02/14/2017 20:57   Dg Chest Port 1 View  Result Date:  02/14/2017 CLINICAL DATA:  Abdominal pain beginning this morning. Nausea. Low back pain. EXAM: PORTABLE CHEST 1 VIEW COMPARISON:  None. FINDINGS: Patient slightly rotated to the left. Lungs are normally expanded without consolidation or effusion. Cardiomediastinal silhouette is within normal. Remaining bones and soft tissue structures are normal. IMPRESSION: No active disease. Electronically Signed   By: Marin Olp M.D.   On: 02/14/2017 18:09   Dg Abd 2 Views  Result Date: 02/19/2017 CLINICAL DATA:  Hx of SBO. EXAM: ABDOMEN - 2 VIEW COMPARISON:  02/18/2017 FINDINGS: Nasogastric tube is in place, tip overlying the level of the stomach. Contrast is identified within nondilated loops of colon upon contrast administration. There continued to be mildly dilated loops of small bowel. Overall small bowel dilatation is improving compared recent exams. IMPRESSION: Continued passage of contrast excluding obstruction. Improving small bowel dilatation. Electronically Signed   By: Nolon Nations M.D.   On: 02/19/2017 12:23   Dg Abd 2 Views  Result Date: 02/17/2017 CLINICAL DATA:  51 y/o female with small bowel obstruction. Abdominal pain nausea and vomiting. EXAM: ABDOMEN - 2 VIEW COMPARISON:  02/16/2017 and earlier. FINDINGS: Upright and supine views of the abdomen and pelvis. Dilated mid abdominal small bowel loops individually up to 44 mm with air-fluid levels, appear mildly larger than yesterday. Paucity of large bowel gas which is stable or decreased compared to yesterday. Mild gaseous distension of the stomach. No pneumoperitoneum. Mild left lung base atelectasis. No acute osseous abnormality identified. IMPRESSION: Stable to mild worsening of small bowel obstruction since yesterday. No free air. Electronically Signed   By: Genevie Ann M.D.   On: 02/17/2017 08:05   Dg Abd 2 Views  Result Date: 02/16/2017 CLINICAL DATA:  Lower abdominal pain with nausea and vomiting and constipation. EXAM: ABDOMEN - 2 VIEW  COMPARISON:  CT scan the abdomen and pelvis of February 14, 2017 FINDINGS: There are multiple air in fluid filled distended small bowel loops in the mid abdomen. There is stool in the left colon and some gas in the right colon. There is gas and fluid within the stomach. There is no gas or stool in the rectum. There are no abnormal soft tissue calcifications. There degenerative changes of the lower lumbar spine. IMPRESSION: Findings compatible with a mid to distal small bowel obstruction. There is no evidence of perforation. Electronically Signed   By: David  Martinique M.D.   On: 02/16/2017 08:36   Dg Abd Portable 1v-small Bowel Obstruction Protocol-initial, 8 Hr Delay  Result Date: 02/18/2017 CLINICAL DATA:  Small bowel protocol 8 hour delay EXAM: PORTABLE ABDOMEN - 1 VIEW COMPARISON:  02/18/2017 FINDINGS: Esophageal tube tip is in the left upper quadrant. Persistent dilated loops of small bowel in the central abdomen measuring up to 3.3 cm. Majority of the contrast is in the colon. IMPRESSION: 1. Continue dilatation of central small bowel loops, slightly improved since 02/17/2017. 2. There is contrast present within the colon and rectum. Electronically Signed   By: Donavan Foil M.D.   On: 02/18/2017 22:14   Dg Abd Portable 1v  Result Date: 02/18/2017 CLINICAL DATA:  51 year old female status post NG tube placement. EXAM: PORTABLE ABDOMEN - 1 VIEW COMPARISON:  02/17/2017 and earlier. FINDINGS: Portable AP supine view at 0759 hours. Enteric tube courses to the left upper quadrant, tip projects over the gastric body. Side hole  is present at the level of the gastric cardia. Stable lung bases. Continued gas-filled dilated mid abdominal small bowel loops with a paucity of large bowel gas, although the loops appear mildly less distended since yesterday (now 38 mm diameter). Stable visualized osseous structures. IMPRESSION: 1. NG tube placed terminating in the stomach. Recommend tube advancement of 5 cm to ensure side  hole placement within the stomach. 2. Continued small bowel obstruction. Electronically Signed   By: Genevie Ann M.D.   On: 02/18/2017 08:25    Microbiology: No results found for this or any previous visit (from the past 240 hour(s)).   Labs: Basic Metabolic Panel:  Recent Labs Lab 02/16/17 0417 02/17/17 0508 02/19/17 0400 02/20/17 0448 02/21/17 0404  NA 139 141 146* 142 140  K 3.7 3.6 3.5 2.8* 3.5  CL 111 111 117* 112* 115*  CO2 21* 19* 22 24 22   GLUCOSE 85 72 95 101* 97  BUN 12 16 17 14 8   CREATININE 0.83 0.88 0.82 0.78 0.63  CALCIUM 7.9* 8.0* 8.2* 8.0* 7.9*  MG 1.9  --   --  1.7  --    Liver Function Tests:  Recent Labs Lab 02/14/17 1824 02/16/17 0417 02/17/17 0508  AST 22 18 18   ALT 13* 12* 12*  ALKPHOS 41 34* 31*  BILITOT 1.3* 0.7 0.7  PROT 7.3 5.6* 5.4*  ALBUMIN 4.5 3.3* 3.2*    Recent Labs Lab 02/14/17 1824 02/16/17 0417 02/17/17 0508  LIPASE 29 22 37   No results for input(s): AMMONIA in the last 168 hours. CBC:  Recent Labs Lab 02/16/17 0417 02/17/17 0508 02/19/17 0400 02/20/17 0448 02/21/17 0404  WBC 3.7* 3.6* 4.7 3.7* 3.3*  NEUTROABS 2.7  --   --   --   --   HGB 10.3* 10.5* 10.4* 10.3* 9.6*  HCT 31.2* 31.1* 31.4* 30.6* 28.8*  MCV 95.7 98.1 96.3 96.5 93.5  PLT 116* 105* 122* 129* 118*   Cardiac Enzymes: No results for input(s): CKTOTAL, CKMB, CKMBINDEX, TROPONINI in the last 168 hours. BNP: BNP (last 3 results) No results for input(s): BNP in the last 8760 hours.  ProBNP (last 3 results) No results for input(s): PROBNP in the last 8760 hours.  CBG: No results for input(s): GLUCAP in the last 168 hours.     SignedCristal Ford  [facility] 02/21/2017, 10:46 AM

## 2017-02-21 NOTE — Progress Notes (Signed)
[facility] Progress Note     Subjective: CC: bloating Patient states that she has been tolerating soft diet and n/v is resolved. Abdominal pain improving, still feels bloated and tight. Having diarrhea, 3 BM yesterday. Walking often.  UOP good. VSS, bradycardia.   Objective: Vital signs in last 24 hours: Temp:  [98.3 F (36.8 C)-98.8 F (37.1 C)] 98.5 F (36.9 C) (06/18 0542) Pulse Rate:  [45-50] 45 (06/18 0542) Resp:  [12-14] 13 (06/18 0542) BP: (99-120)/(63-73) 115/73 (06/18 0542) SpO2:  [96 %-99 %] 96 % (06/18 0542) Last BM Date: 02/20/17  Intake/Output from previous day: 06/17 0701 - 06/18 0700 In: 1239.7 [P.O.:840; I.V.:349.7; IV Piggyback:50] Out: -  Intake/Output this shift: No intake/output data recorded.  PE: Gen:  Alert, NAD, pleasant Card:  Bradycardia, regular rhythm, no M/G/R Pulm:  Normal effort, clear to auscultation bilaterally Abd: Soft, mild generalized TTP, mildly distended, bowel sounds present in all 4 quadrants Skin: warm and dry, no rashes  Psych: A&Ox3   Lab Results:   Recent Labs  02/20/17 0448 02/21/17 0404  WBC 3.7* 3.3*  HGB 10.3* 9.6*  HCT 30.6* 28.8*  PLT 129* 118*   BMET  Recent Labs  02/20/17 0448 02/21/17 0404  NA 142 140  K 2.8* 3.5  CL 112* 115*  CO2 24 22  GLUCOSE 101* 97  BUN 14 8  CREATININE 0.78 0.63  CALCIUM 8.0* 7.9*   CMP     Component Value Date/Time   NA 140 02/21/2017 0404   K 3.5 02/21/2017 0404   CL 115 (H) 02/21/2017 0404   CO2 22 02/21/2017 0404   GLUCOSE 97 02/21/2017 0404   BUN 8 02/21/2017 0404   CREATININE 0.63 02/21/2017 0404   CALCIUM 7.9 (L) 02/21/2017 0404   PROT 5.4 (L) 02/17/2017 0508   ALBUMIN 3.2 (L) 02/17/2017 0508   AST 18 02/17/2017 0508   ALT 12 (L) 02/17/2017 0508   ALKPHOS 31 (L) 02/17/2017 0508   BILITOT 0.7 02/17/2017 0508   GFRNONAA >60 02/21/2017 0404   GFRAA >60 02/21/2017 0404   Lipase     Component Value Date/Time   LIPASE 37 02/17/2017 0508      Studies/Results: Dg Abd 2 Views  Result Date: 02/19/2017 CLINICAL DATA:  Hx of SBO. EXAM: ABDOMEN - 2 VIEW COMPARISON:  02/18/2017 FINDINGS: Nasogastric tube is in place, tip overlying the level of the stomach. Contrast is identified within nondilated loops of colon upon contrast administration. There continued to be mildly dilated loops of small bowel. Overall small bowel dilatation is improving compared recent exams. IMPRESSION: Continued passage of contrast excluding obstruction. Improving small bowel dilatation. Electronically Signed   By: Nolon Nations M.D.   On: 02/19/2017 12:23     Assessment/Plan Gastroenteritis vs. Ileus vs. SBO - NGT removed 6/16 - tolerating SOFT diet, bowel function restored - clinically improving  Hypokalemia - 3.5 today, primary managing  Hx of tubal ligation   FEN - SOFT diet, K and MG replaced.  VTE - SCDs ID - no abx  Dispo: Patient is stable for discharge from our standpoint, no surgical intervention required. We will sign off at this point, can call with questions or concerns.   LOS: 6 days    Brigid Re , [facility] Surgery 02/21/2017, 8:03 AM Pager: [phone] Consults: [phone] Mon-Fri 7:00 am-4:30 pm Sat-Sun 7:00 am-11:30 am  Agree with above.  Alphonsa Overall, MD, [facility] Surgery Pager: [phone] Office phone:  [phone]

## 2017-02-21 NOTE — Discharge Instructions (Signed)
Viral Gastroenteritis, Adult Viral gastroenteritis is also known as the stomach flu. This condition is caused by various viruses. These viruses can be passed from person to person very easily (are very contagious). This condition may affect your stomach, small intestine, and large intestine. It can cause sudden watery diarrhea, fever, and vomiting. Diarrhea and vomiting can make you feel weak and cause you to become dehydrated. You may not be able to keep fluids down. Dehydration can make you tired and thirsty, cause you to have a dry mouth, and decrease how often you urinate. Older adults and people with other diseases or a weak immune system are at higher risk for dehydration. It is important to replace the fluids that you lose from diarrhea and vomiting. If you become severely dehydrated, you may need to get fluids through an IV tube. What are the causes? Gastroenteritis is caused by various viruses, including rotavirus and norovirus. Norovirus is the most common cause in adults. You can get sick by eating food, drinking water, or touching a surface contaminated with one of these viruses. You can also get sick from sharing utensils or other personal items with an infected person. What increases the risk? This condition is more likely to develop in people:  Who have a weak defense system (immune system).  Who live with one or more children who are younger than 80 years old.  Who live in a nursing home.  Who go on cruise ships.  What are the signs or symptoms? Symptoms of this condition start suddenly 1-2 days after exposure to a virus. Symptoms may last a few days or as long as a week. The most common symptoms are watery diarrhea and vomiting. Other symptoms include:  Fever.  Headache.  Fatigue.  Pain in the abdomen.  Chills.  Weakness.  Nausea.  Muscle aches.  Loss of appetite.  How is this diagnosed? This condition is diagnosed with a medical history and physical exam. You  may also have a stool test to check for viruses or other infections. How is this treated? This condition typically goes away on its own. The focus of treatment is to restore lost fluids (rehydration). Your health care provider may recommend that you take an oral rehydration solution (ORS) to replace important salts and minerals (electrolytes) in your body. Severe cases of this condition may require giving fluids through an IV tube. Treatment may also include medicine to help with your symptoms. Follow these instructions at home: Follow instructions from your health care provider about how to care for yourself at home. Eating and drinking Follow these recommendations as told by your health care provider:  Take an ORS. This is a drink that is sold at pharmacies and retail stores.  Drink clear fluids in small amounts as you are able. Clear fluids include water, ice chips, diluted fruit juice, and low-calorie sports drinks.  Eat bland, easy-to-digest foods in small amounts as you are able. These foods include bananas, applesauce, rice, lean meats, toast, and crackers.  Avoid fluids that contain a lot of sugar or caffeine, such as energy drinks, sports drinks, and soda.  Avoid alcohol.  Avoid spicy or fatty foods.  General instructions   Drink enough fluid to keep your urine clear or pale yellow.  Wash your hands often. If soap and water are not available, use hand sanitizer.  Make sure that all people in your household wash their hands well and often.  Take over-the-counter and prescription medicines only as told by your health  care provider.  Rest at home while you recover.  Watch your condition for any changes.  Take a warm bath to relieve any burning or pain from frequent diarrhea episodes.  Keep all follow-up visits as told by your health care provider. This is important. Contact a health care provider if:  You cannot keep fluids down.  Your symptoms get worse.  You have  new symptoms.  You feel light-headed or dizzy.  You have muscle cramps. Get help right away if:  You have chest pain.  You feel extremely weak or you faint.  You see blood in your vomit.  Your vomit looks like coffee grounds.  You have bloody or black stools or stools that look like tar.  You have a severe headache, a stiff neck, or both.  You have a rash.  You have severe pain, cramping, or bloating in your abdomen.  You have trouble breathing or you are breathing very quickly.  Your heart is beating very quickly.  Your skin feels cold and clammy.  You feel confused.  You have pain when you urinate.  You have signs of dehydration, such as: ? Dark urine, very little urine, or no urine. ? Cracked lips. ? Dry mouth. ? Sunken eyes. ? Sleepiness. ? Weakness. This information is not intended to replace advice given to you by your health care provider. Make sure you discuss any questions you have with your health care provider. Document Released: 08/23/2005 Document Revised: 02/04/2016 Document Reviewed: 04/29/2015 Elsevier Interactive Patient Education  2017 [employer] Bowel Obstruction A small bowel obstruction is a blockage in the small bowel. The small bowel, which is also called the small intestine, is a long, slender tube that connects the stomach to the colon. When a person eats and drinks, food and fluids go from the stomach to the small bowel. This is where most of the nutrients in the food and fluids are absorbed. A small bowel obstruction will prevent food and fluids from passing through the small bowel as they normally do during digestion. The small bowel can become partially or completely blocked. This can cause symptoms such as abdominal pain, vomiting, and bloating. If this condition is not treated, it can be dangerous because the small bowel could rupture. What are the causes? Common causes of this condition include:  Scar tissue from previous  surgery or radiation treatment.  Recent surgery. This may cause the movements of the bowel to slow down and cause food to block the intestine.  Hernias.  Inflammatory bowel disease (colitis).  Twisting of the bowel (volvulus).  Tumors.  A foreign body.  Slipping of a part of the bowel into another part (intussusception).  What are the signs or symptoms? Symptoms of this condition include:  Abdominal pain. This may be dull cramps or sharp pain. It may occur in one area, or it may be present in the entire abdomen. Pain can range from mild to severe, depending on the degree of obstruction.  Nausea and vomiting. Vomit may be greenish or a yellow bile color.  Abdominal bloating.  Constipation.  Lack of passing gas.  Frequent belching.  Diarrhea. This may occur if the obstruction is partial and runny stool is able to leak around the obstruction.  How is this diagnosed? This condition may be diagnosed based on a physical exam, medical history, and X-rays of the abdomen. You may also have other tests, such as a CT scan of the abdomen and pelvis. How is this  treated? Treatment for this condition depends on the cause and severity of the problem. Treatment options may include:  Bed rest along with fluids and pain medicines that are given through an IV tube inserted into one of your veins. Sometimes, this is all that is needed for the obstruction to improve.  Following a simple diet. In some cases, a clear liquid diet may be required for several days. This allows the bowel to rest.  Placement of a small tube (nasogastric tube) into the stomach. When the bowel is blocked, it usually swells up like a balloon that is filled with air and fluids. The air and fluids may be removed by suction through the nasogastric tube. This can help with pain, discomfort, and nausea. It can also help the obstruction to clear up faster.  Surgery. This may be required if other treatments do not work. Bowel  obstruction from a hernia may require early surgery and can be an emergency procedure. Surgery may also be required for scar tissue that causes frequent or severe obstructions.  Follow these instructions at home:  Get plenty of rest.  Follow instructions from your health care provider about eating restrictions. You may need to avoid solid foods and consume only clear liquids until your condition improves.  Take over-the-counter and prescription medicines only as told by your health care provider.  Keep all follow-up visits as told by your health care provider. This is important. Contact a health care provider if:  You have a fever.  You have chills. Get help right away if:  You have increased pain or cramping.  You vomit blood.  You have uncontrolled vomiting or nausea.  You cannot drink fluids because of vomiting or pain.  You develop confusion.  You begin feeling very dry or thirsty (dehydrated).  You have severe bloating.  You feel extremely weak or you faint. This information is not intended to replace advice given to you by your health care provider. Make sure you discuss any questions you have with your health care provider. Document Released: 11/09/2005 Document Revised: 04/19/2016 Document Reviewed: 10/17/2014 Elsevier Interactive Patient Education  2017 [employer].

## 2017-02-22 ENCOUNTER — Other Ambulatory Visit: Payer: Self-pay | Admitting: Physician Assistant

## 2017-02-22 ENCOUNTER — Ambulatory Visit
Admission: RE | Admit: 2017-02-22 | Discharge: 2017-02-22 | Disposition: A | Discharge status: Home / Self Care | Payer: 59 | Source: Ambulatory Visit | Attending: Physician Assistant | Admitting: Physician Assistant

## 2017-02-22 DIAGNOSIS — R059 Cough, unspecified: Secondary | ICD-10-CM

## 2017-02-22 DIAGNOSIS — K56609 Unspecified intestinal obstruction, unspecified as to partial versus complete obstruction: Secondary | ICD-10-CM | POA: Diagnosis not present

## 2017-02-22 DIAGNOSIS — D649 Anemia, unspecified: Secondary | ICD-10-CM | POA: Diagnosis not present

## 2017-02-22 DIAGNOSIS — R609 Edema, unspecified: Secondary | ICD-10-CM | POA: Diagnosis not present

## 2017-02-22 DIAGNOSIS — R05 Cough: Secondary | ICD-10-CM | POA: Diagnosis not present

## 2017-02-22 IMAGING — DX DG CHEST 2V
2 series · 2 of 2 positions shown · non-contrast
Comparison: [DATE]

CLINICAL DATA: Cough and congestion

EXAM:
CHEST  2 VIEW

[dg chest 2 view (1 of 2)]
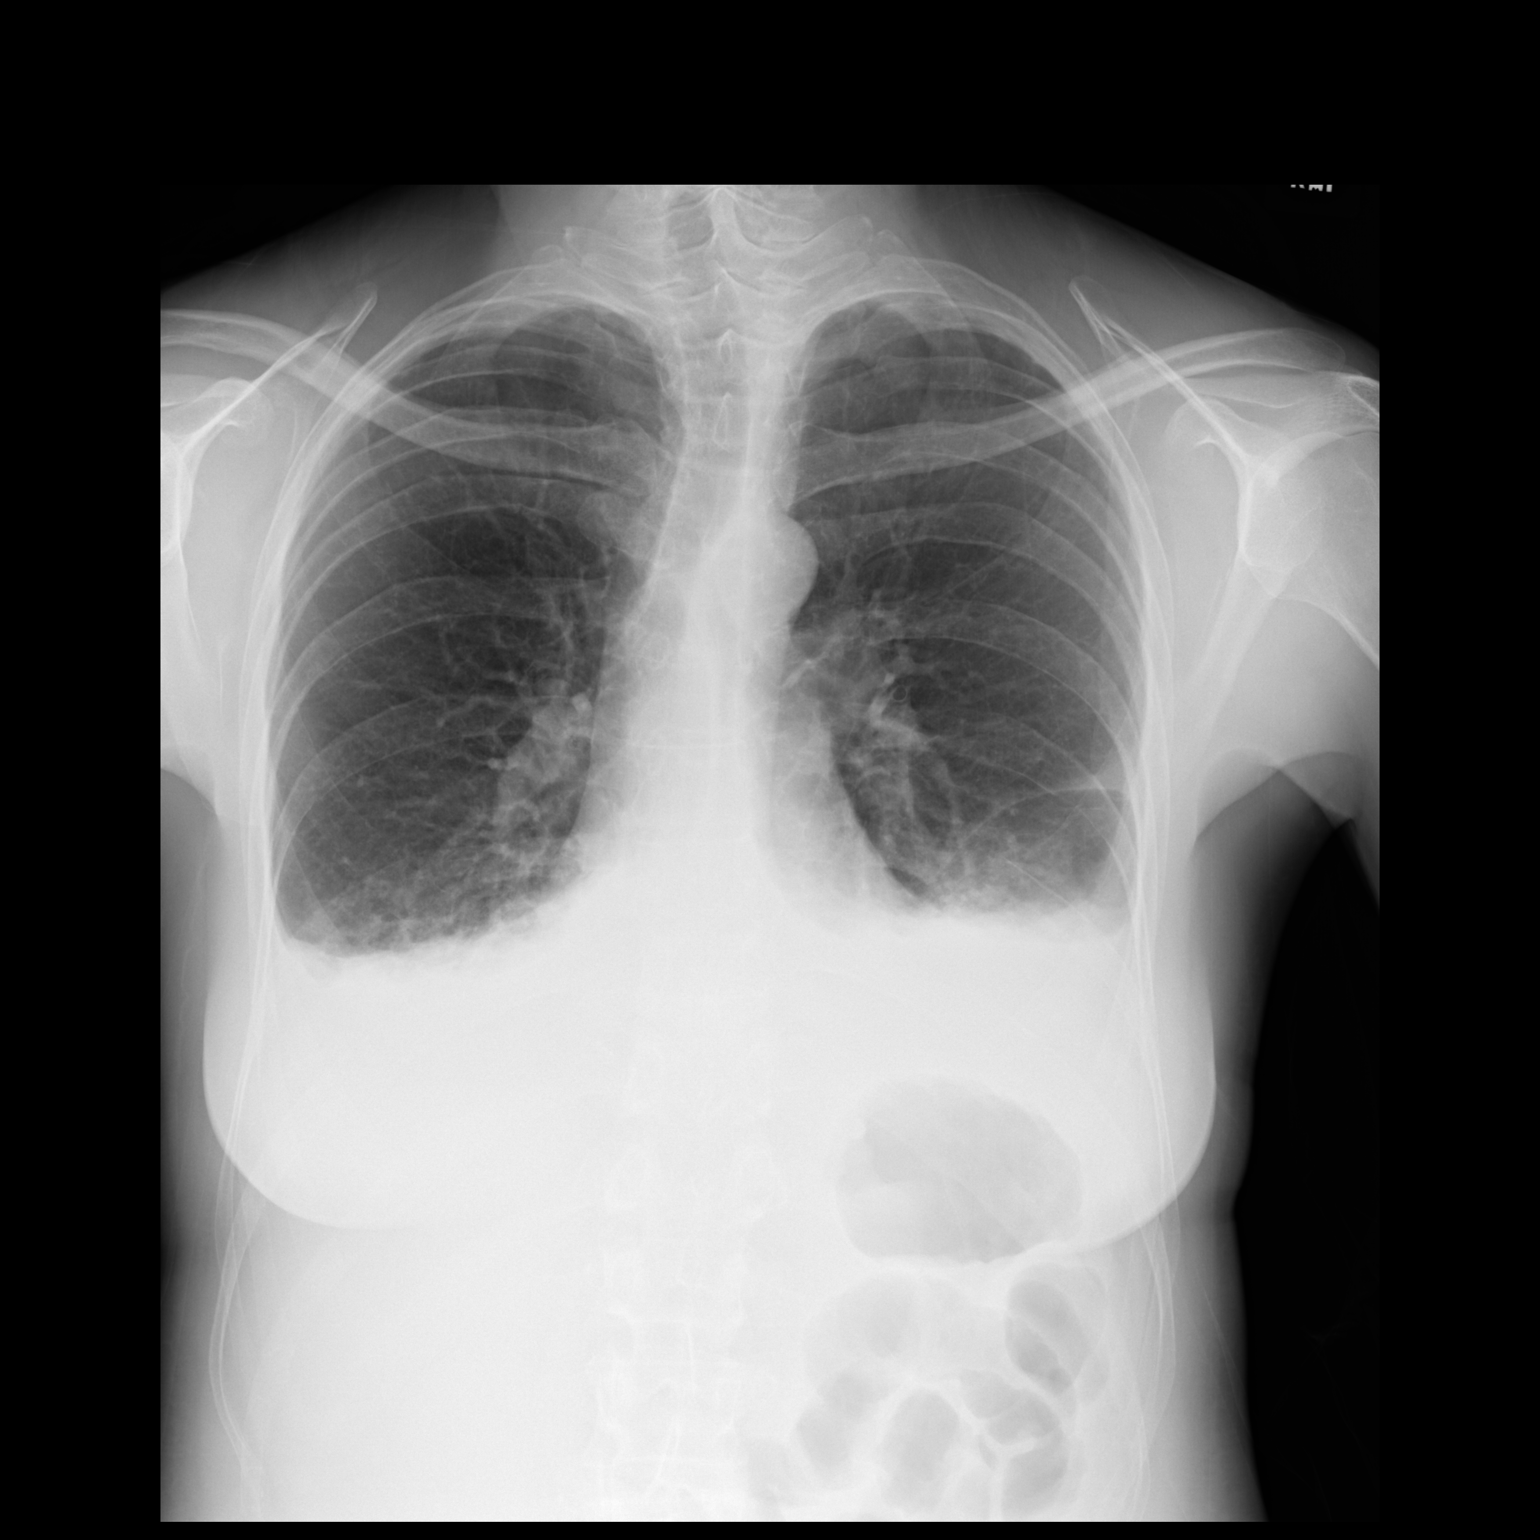

[dg chest 2 view (2 of 2)]
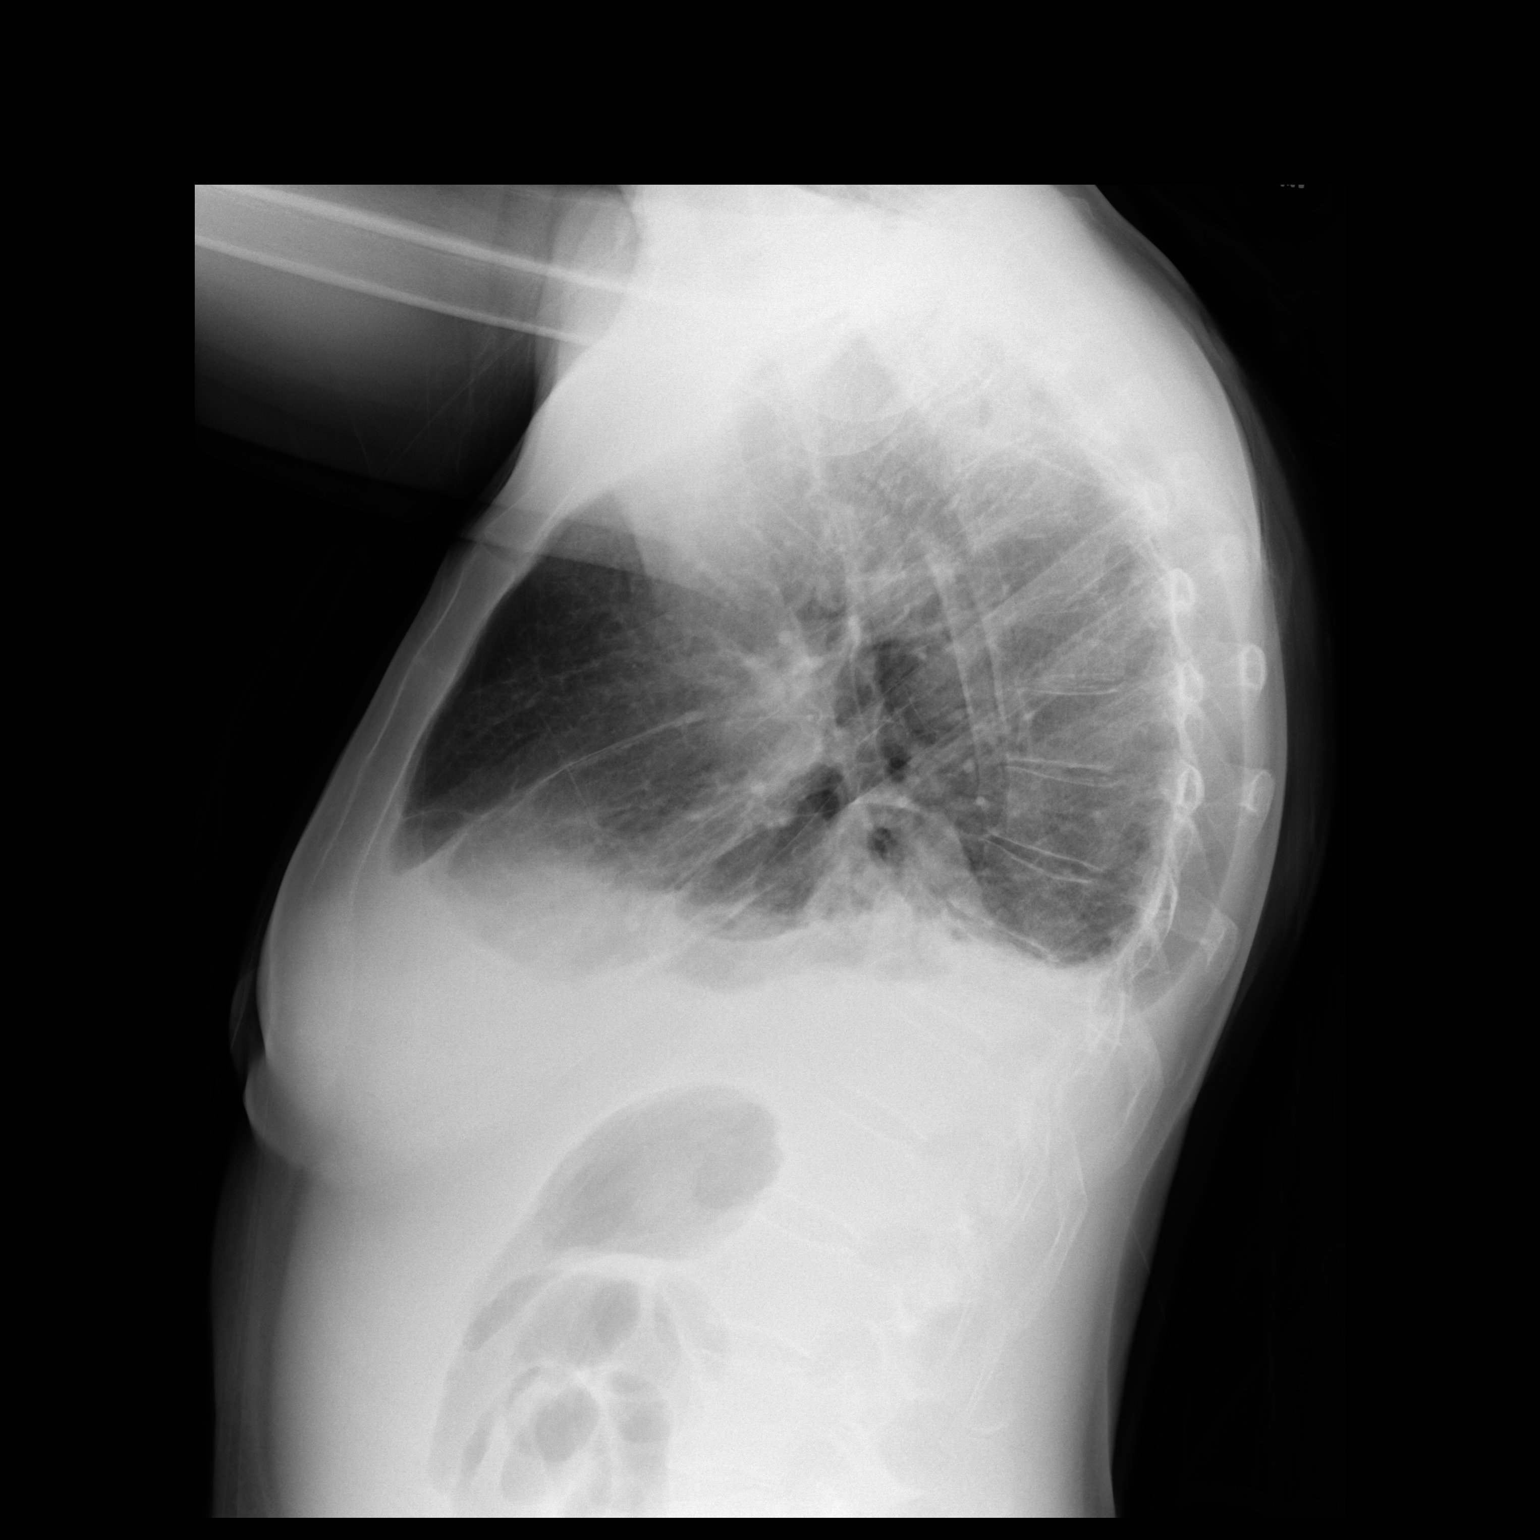

[2 of 2 positions shown; findings below may reference images not displayed]

FINDINGS: Cardiac shadows within normal limits. Bibasilar atelectatic changes
and moderate pleural effusions are now seen. No acute bony
abnormality is noted.
IMPRESSION: Bibasilar atelectasis and effusions.

## 2017-02-28 ENCOUNTER — Other Ambulatory Visit: Payer: Self-pay | Admitting: Physician Assistant

## 2017-02-28 ENCOUNTER — Ambulatory Visit
Admission: RE | Admit: 2017-02-28 | Discharge: 2017-02-28 | Disposition: A | Discharge status: Home / Self Care | Payer: 59 | Source: Ambulatory Visit | Attending: Physician Assistant | Admitting: Physician Assistant

## 2017-02-28 DIAGNOSIS — R059 Cough, unspecified: Secondary | ICD-10-CM

## 2017-02-28 DIAGNOSIS — K56609 Unspecified intestinal obstruction, unspecified as to partial versus complete obstruction: Secondary | ICD-10-CM | POA: Diagnosis not present

## 2017-02-28 DIAGNOSIS — R05 Cough: Secondary | ICD-10-CM

## 2017-02-28 IMAGING — CR DG ABDOMEN 1V
1 series · 1 of 1 positions shown · non-contrast
Comparison: None.

CLINICAL DATA: Small bowel obstruction, no bowel movement,
constipation

EXAM:
ABDOMEN - 1 VIEW

[t abdomen supine]
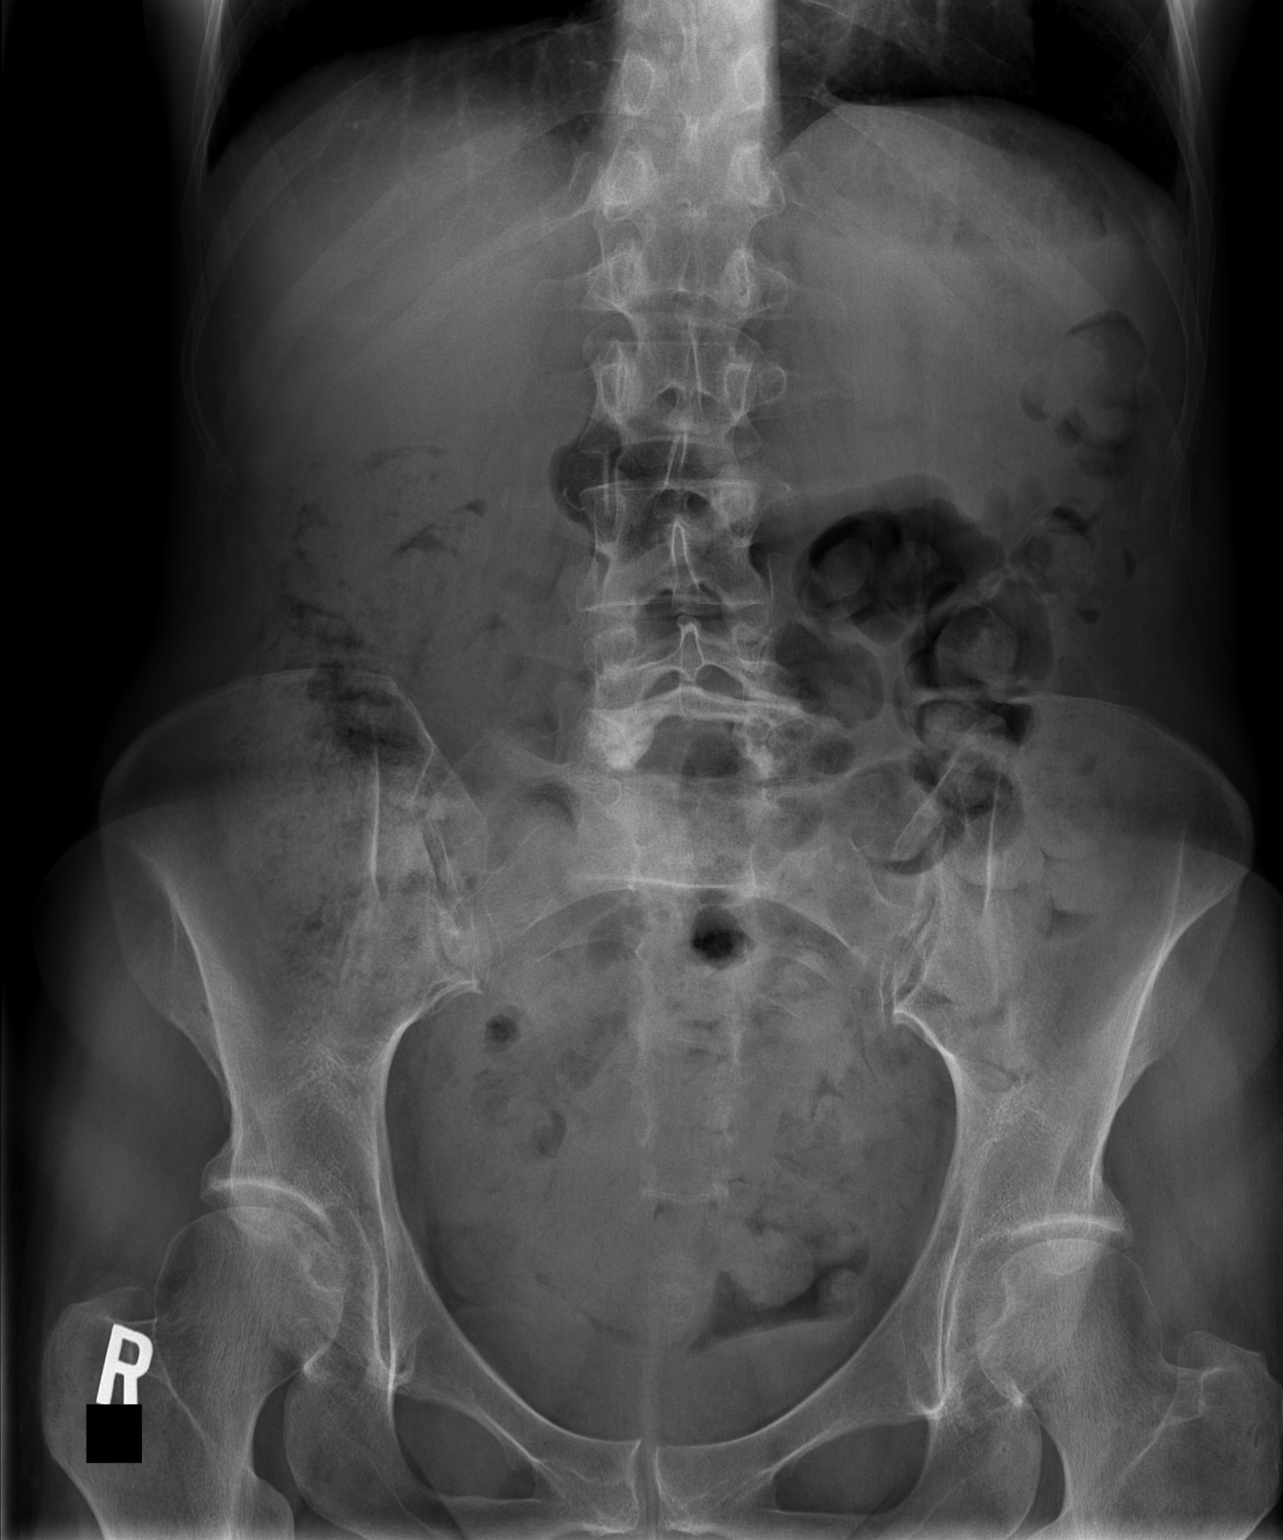

[1 of 1 positions shown; findings below may reference images not displayed]

FINDINGS: Moderate amount of stool throughout the colon. There is no bowel
dilatation to suggest obstruction. There is no evidence of
pneumoperitoneum, portal venous gas or pneumatosis. There are no
pathologic calcifications along the expected course of the ureters.
The osseous structures are unremarkable.
IMPRESSION: Moderate amount of stool throughout the colon.

## 2017-03-02 DIAGNOSIS — L298 Other pruritus: Secondary | ICD-10-CM | POA: Diagnosis not present

## 2017-03-02 DIAGNOSIS — R609 Edema, unspecified: Secondary | ICD-10-CM | POA: Diagnosis not present

## 2017-03-02 DIAGNOSIS — K5900 Constipation, unspecified: Secondary | ICD-10-CM | POA: Diagnosis not present

## 2017-03-18 DIAGNOSIS — I862 Pelvic varices: Secondary | ICD-10-CM | POA: Diagnosis not present

## 2017-03-18 DIAGNOSIS — R102 Pelvic and perineal pain: Secondary | ICD-10-CM | POA: Diagnosis not present

## 2017-05-25 DIAGNOSIS — R399 Unspecified symptoms and signs involving the genitourinary system: Secondary | ICD-10-CM | POA: Diagnosis not present

## 2017-06-26 DIAGNOSIS — Z23 Encounter for immunization: Secondary | ICD-10-CM | POA: Diagnosis not present

## 2017-10-07 DIAGNOSIS — M792 Neuralgia and neuritis, unspecified: Secondary | ICD-10-CM | POA: Diagnosis not present

## 2017-10-07 DIAGNOSIS — M6248 Contracture of muscle, other site: Secondary | ICD-10-CM | POA: Diagnosis not present

## 2017-10-10 DIAGNOSIS — N6310 Unspecified lump in the right breast, unspecified quadrant: Secondary | ICD-10-CM | POA: Diagnosis not present

## 2017-10-11 ENCOUNTER — Other Ambulatory Visit: Payer: Self-pay | Admitting: Obstetrics and Gynecology

## 2017-10-11 DIAGNOSIS — N6310 Unspecified lump in the right breast, unspecified quadrant: Secondary | ICD-10-CM

## 2017-10-14 ENCOUNTER — Other Ambulatory Visit: Payer: 59

## 2017-10-14 DIAGNOSIS — M542 Cervicalgia: Secondary | ICD-10-CM | POA: Diagnosis not present

## 2017-10-21 ENCOUNTER — Ambulatory Visit
Admission: RE | Admit: 2017-10-21 | Discharge: 2017-10-21 | Disposition: A | Discharge status: Home / Self Care | Payer: 59 | Source: Ambulatory Visit | Attending: Obstetrics and Gynecology | Admitting: Obstetrics and Gynecology

## 2017-10-21 ENCOUNTER — Other Ambulatory Visit: Payer: Self-pay | Admitting: Obstetrics and Gynecology

## 2017-10-21 DIAGNOSIS — R922 Inconclusive mammogram: Secondary | ICD-10-CM | POA: Diagnosis not present

## 2017-10-21 DIAGNOSIS — N6310 Unspecified lump in the right breast, unspecified quadrant: Secondary | ICD-10-CM

## 2017-10-21 DIAGNOSIS — N6320 Unspecified lump in the left breast, unspecified quadrant: Secondary | ICD-10-CM

## 2017-10-21 DIAGNOSIS — N6011 Diffuse cystic mastopathy of right breast: Secondary | ICD-10-CM | POA: Diagnosis not present

## 2017-10-21 DIAGNOSIS — M542 Cervicalgia: Secondary | ICD-10-CM | POA: Diagnosis not present

## 2017-10-21 IMAGING — US ULTRASOUND LEFT BREAST LIMITED
1 series · 8 of 8 positions shown · non-contrast
Comparison: Previous exam(s).

CLINICAL DATA: 51-year-old female presenting for evaluation of
palpable lumps in the bilateral breasts.

EXAM:
DIGITAL DIAGNOSTIC BILATERAL MAMMOGRAM WITH CAD AND TOMO
BILATERAL BREAST ULTRASOUND

[Series 1: ultrasound left breast limited · 0.05mm/px · 8 of 8 slices shown]
[im 1/8]
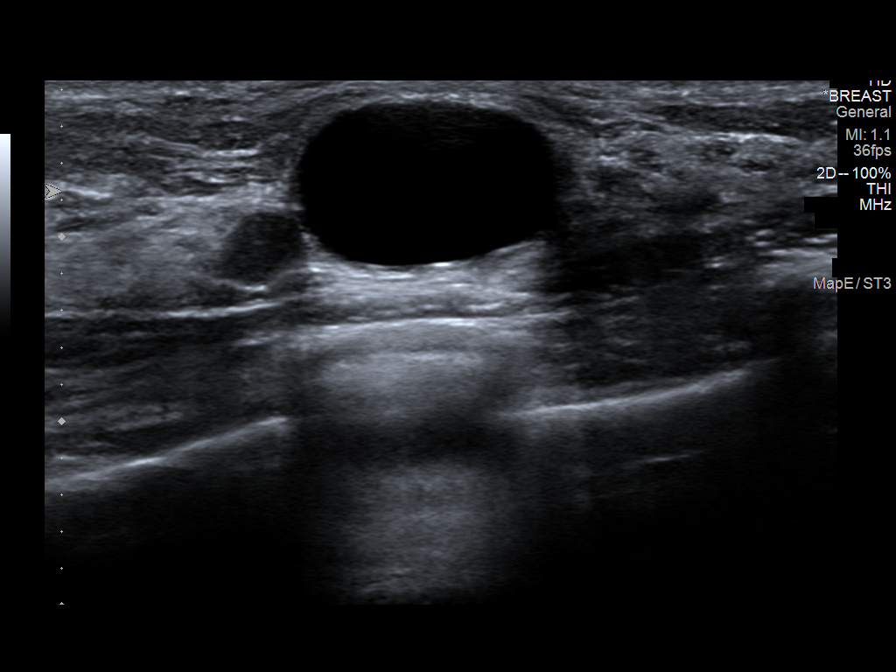
[im 2/8]
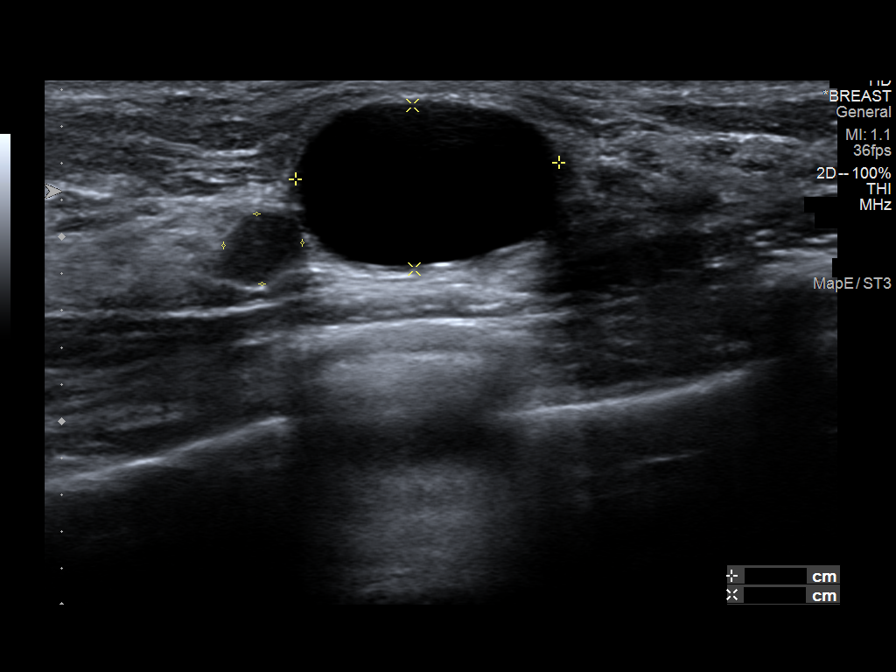
[im 3/8]
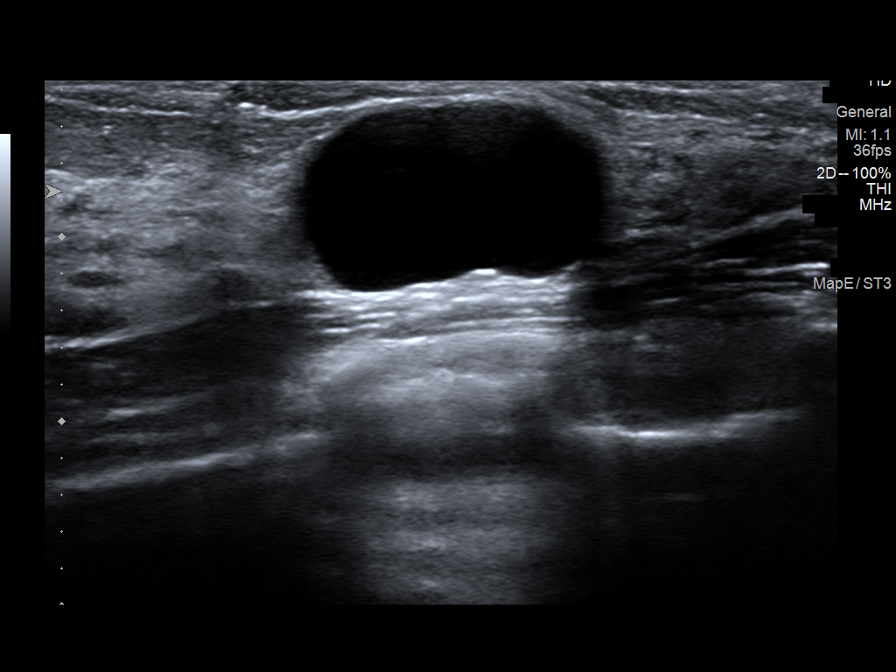
[im 4/8]
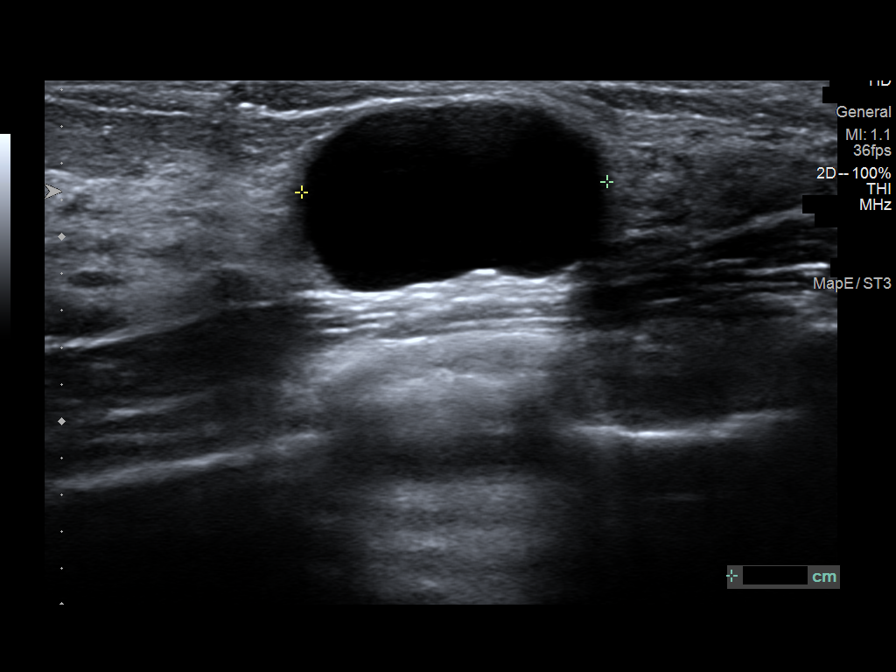
[im 5/8]
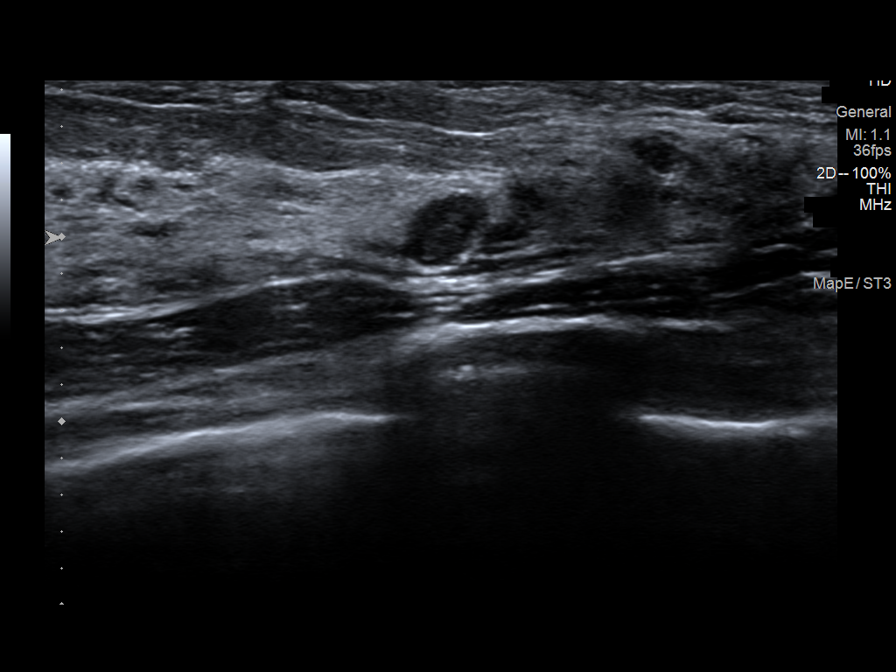
[im 6/8]
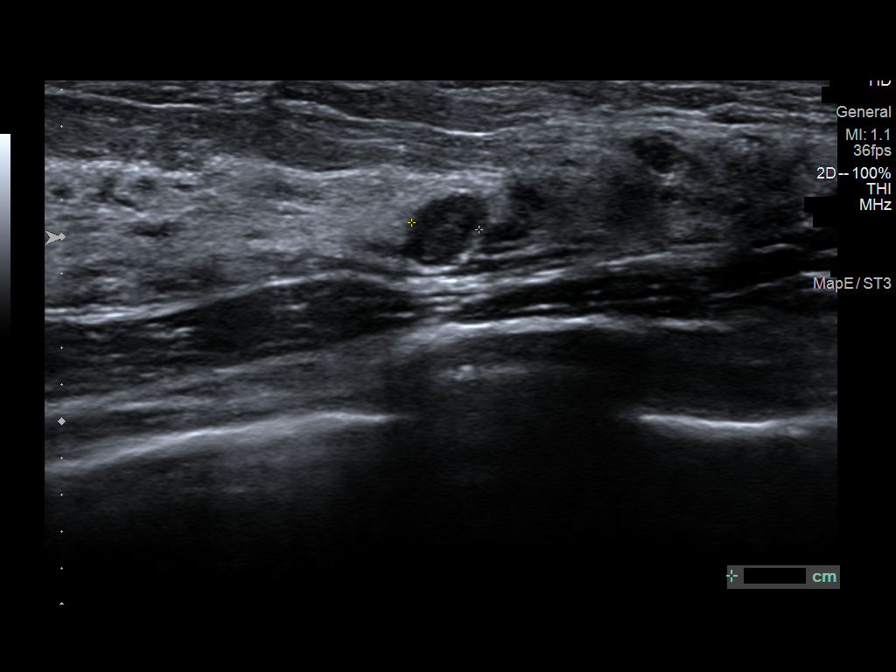
[im 7/8]
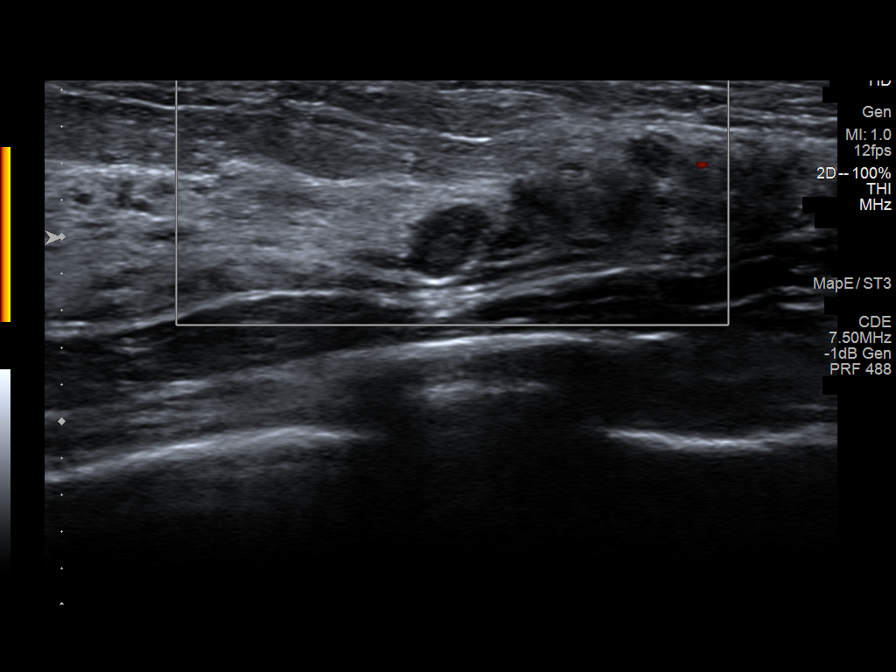
[im 8/8]
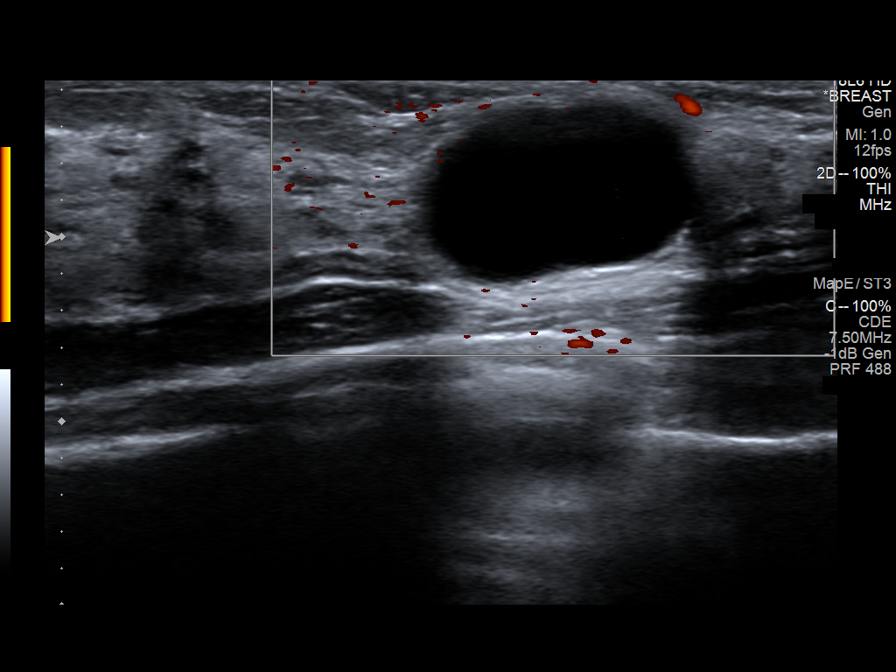

[8 of 8 positions shown; findings below may reference images not displayed]

ACR Breast Density Category c: The breast tissue is heterogeneously
dense, which may obscure small masses.
FINDINGS: A BB has been placed adjacent to the nipple on the right breast.
Deep to the marker there is a large oval circumscribed mass
measuring approximately 4 cm. In the lateral aspect of the left
breast another BB has been placed demonstrating a circumscribed oval
mass measuring approximately 1.7 cm. No other suspicious
calcifications, masses or areas of distortion are seen in the
bilateral breasts.

Mammographic images were processed with CAD.

Ultrasound targeted to the right breast at 9 o'clock, 1 cm from the
nipple demonstrates an oval circumscribed heterogeneously hypoechoic
mass measuring 3.9 x 2.4 x 3.3 cm. The heterogeneous material within
the mass swells and moves within the fluid, consistent with a
complicated cyst, and is therefore benign.

Ultrasound of the left breast at [DATE], 4 cm from the nipple
demonstrates a circumscribed anechoic oval mass measuring 1.7 x
x 1.4 cm, consistent with a cyst. Immediately adjacent to this is a
hypoechoic oval mass measuring 4 x 4 x 4 mm.
IMPRESSION: 1. The palpable masses in the bilateral breasts correspond with
benign cysts.

2. There is an incidentally noted hypoechoic mass adjacent to the
palpable mass in the left breast at [DATE]. This likely represents a
small complicated cyst.

3.  No mammographic evidence of malignancy in the bilateral breasts.

RECOMMENDATION:
1. Six-month follow-up left breast ultrasound is recommended for the
small probably benign mass at [DATE].

2. Aspiration was offered but declined by the patient at this time,
as she is asymptomatic.

I have discussed the findings and recommendations with the patient.
Results were also provided in writing at the conclusion of the
visit. If applicable, a reminder letter will be sent to the patient
regarding the next appointment.

BI-RADS CATEGORY  3: Probably benign.

## 2017-10-21 IMAGING — MG DIGITAL DIAGNOSTIC BILATERAL MAMMOGRAM WITH TOMO AND CAD
6 of 12 series · 6 of 36 positions shown · non-contrast
Comparison: Previous exam(s).

CLINICAL DATA: 51-year-old female presenting for evaluation of
palpable lumps in the bilateral breasts.

EXAM:
DIGITAL DIAGNOSTIC BILATERAL MAMMOGRAM WITH CAD AND TOMO
BILATERAL BREAST ULTRASOUND

[R CC synth-2D]
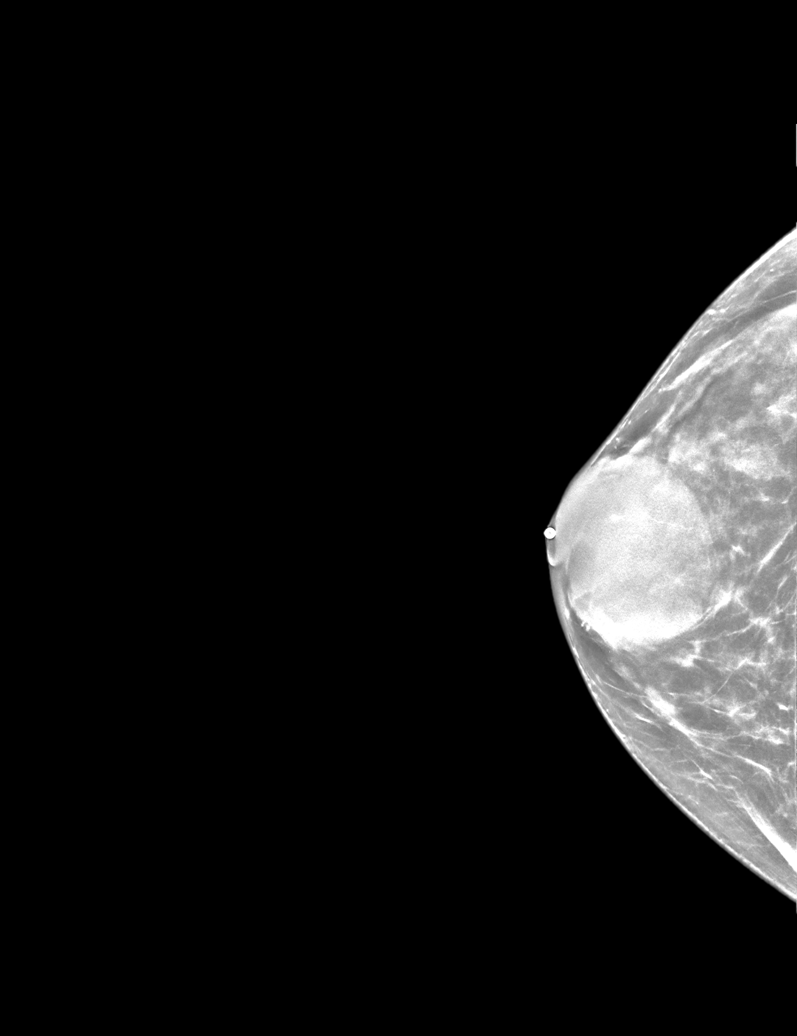

[R MLO synth-2D]
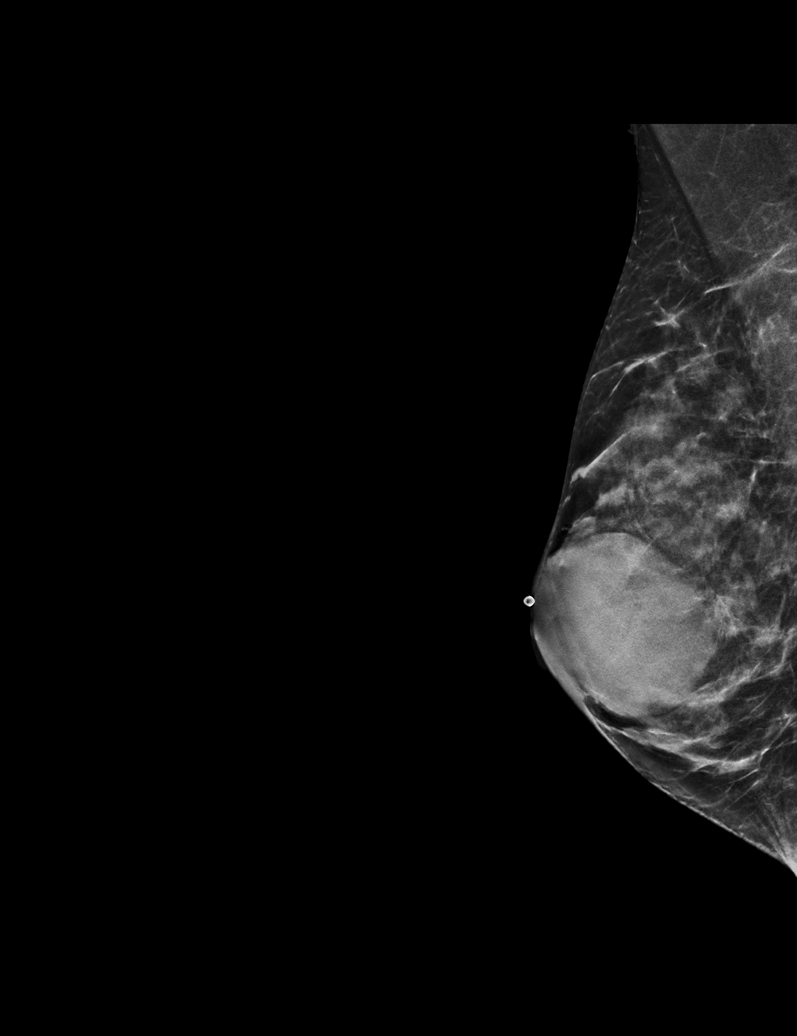

[R TAN synth-2D]
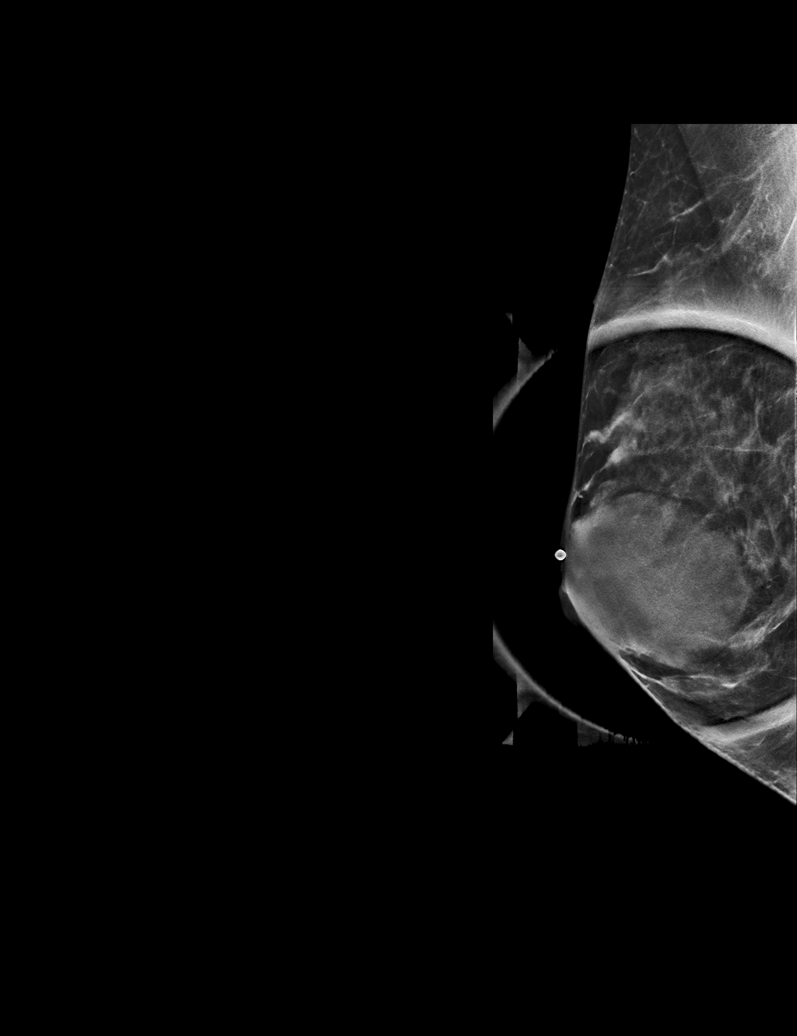

[L CC synth-2D]
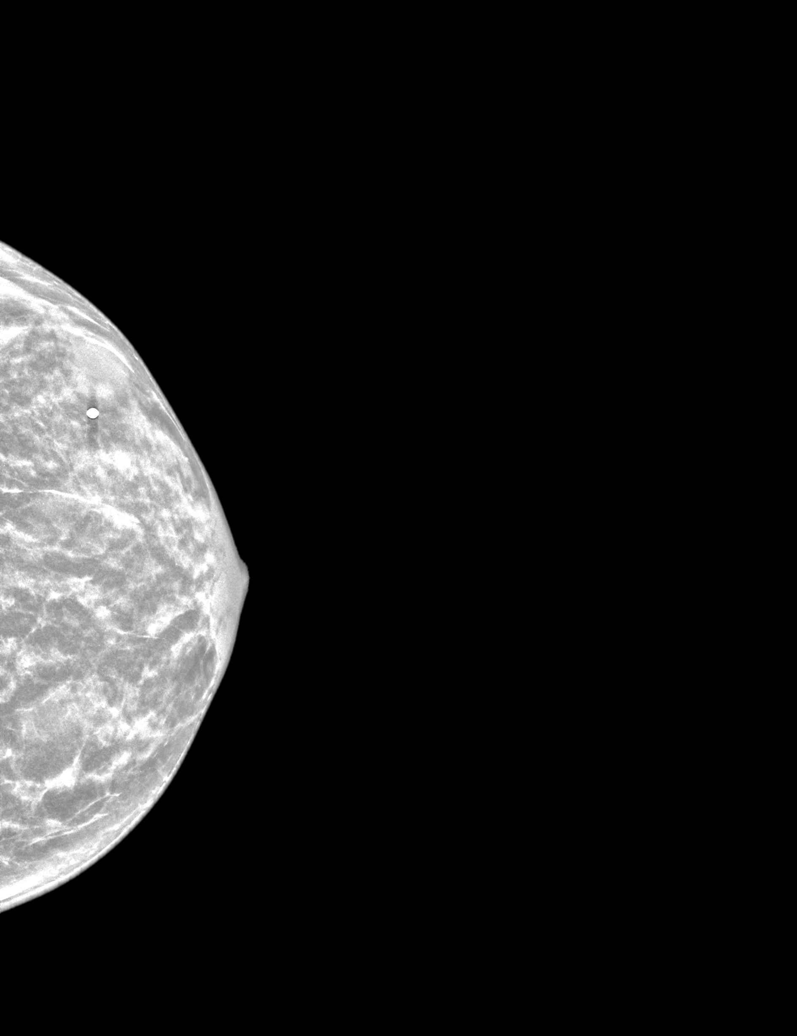

[L TAN synth-2D]
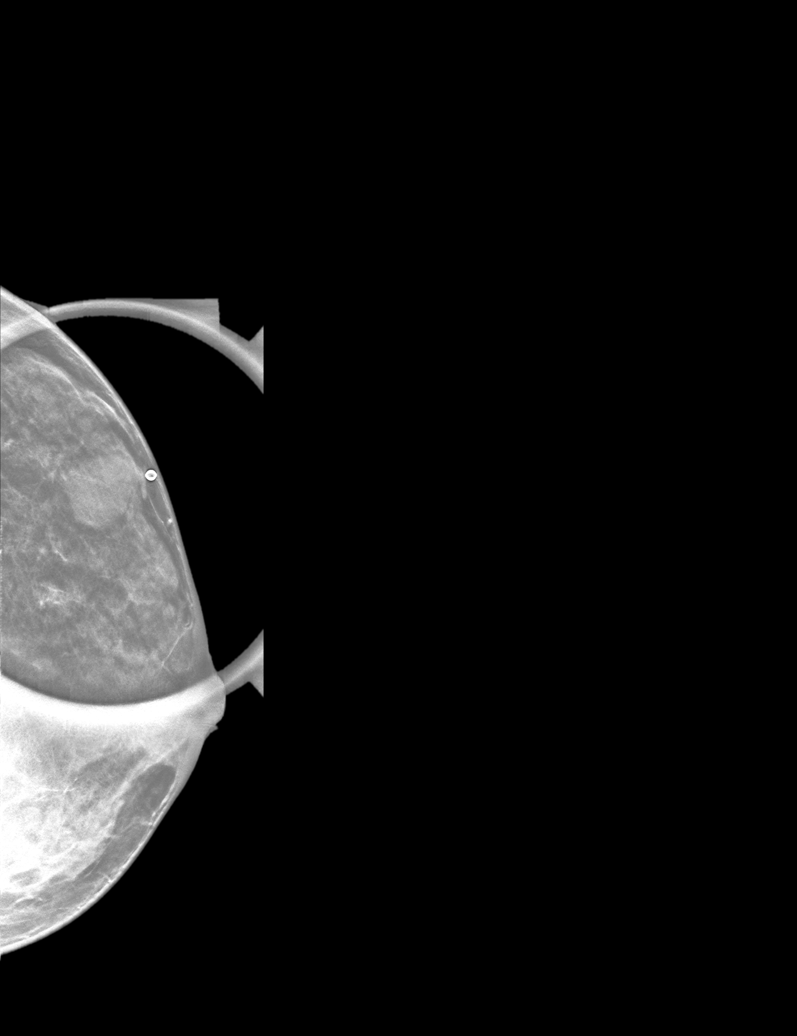

[L MLO synth-2D]
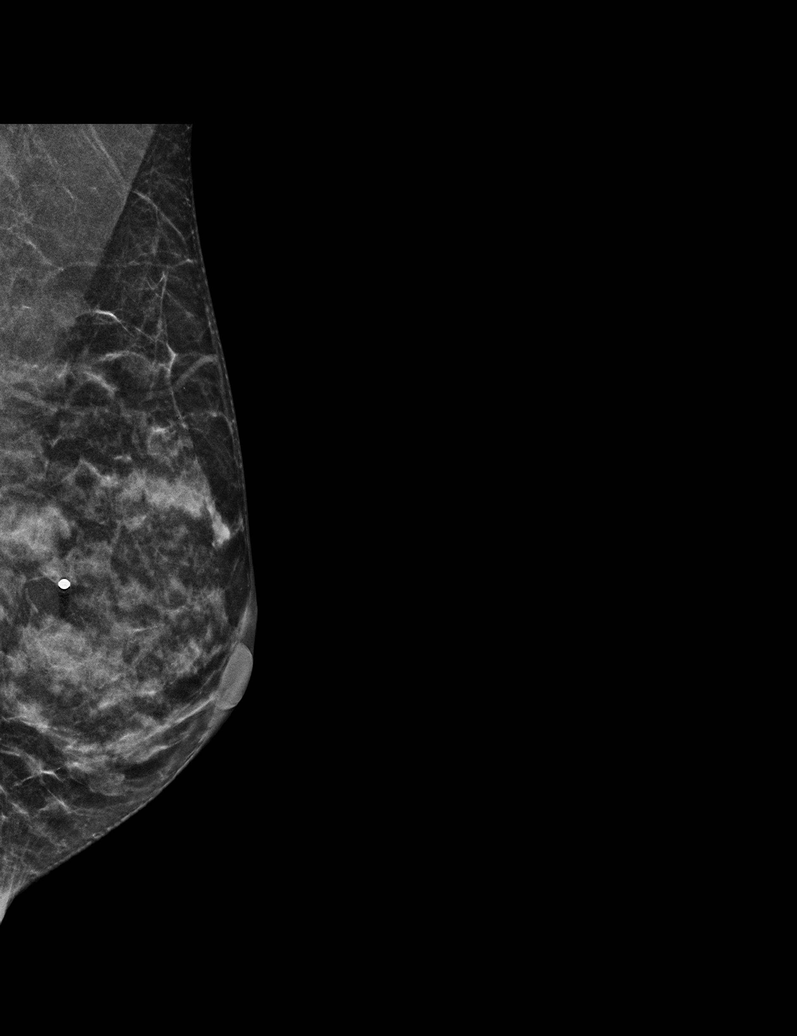

[6 of 36 positions shown; findings below may reference images not displayed]

ACR Breast Density Category c: The breast tissue is heterogeneously
dense, which may obscure small masses.
FINDINGS: A BB has been placed adjacent to the nipple on the right breast.
Deep to the marker there is a large oval circumscribed mass
measuring approximately 4 cm. In the lateral aspect of the left
breast another BB has been placed demonstrating a circumscribed oval
mass measuring approximately 1.7 cm. No other suspicious
calcifications, masses or areas of distortion are seen in the
bilateral breasts.

Mammographic images were processed with CAD.

Ultrasound targeted to the right breast at 9 o'clock, 1 cm from the
nipple demonstrates an oval circumscribed heterogeneously hypoechoic
mass measuring 3.9 x 2.4 x 3.3 cm. The heterogeneous material within
the mass swells and moves within the fluid, consistent with a
complicated cyst, and is therefore benign.

Ultrasound of the left breast at [DATE], 4 cm from the nipple
demonstrates a circumscribed anechoic oval mass measuring 1.7 x
x 1.4 cm, consistent with a cyst. Immediately adjacent to this is a
hypoechoic oval mass measuring 4 x 4 x 4 mm.
IMPRESSION: 1. The palpable masses in the bilateral breasts correspond with
benign cysts.

2. There is an incidentally noted hypoechoic mass adjacent to the
palpable mass in the left breast at [DATE]. This likely represents a
small complicated cyst.

3.  No mammographic evidence of malignancy in the bilateral breasts.

RECOMMENDATION:
1. Six-month follow-up left breast ultrasound is recommended for the
small probably benign mass at [DATE].

2. Aspiration was offered but declined by the patient at this time,
as she is asymptomatic.

I have discussed the findings and recommendations with the patient.
Results were also provided in writing at the conclusion of the
visit. If applicable, a reminder letter will be sent to the patient
regarding the next appointment.

BI-RADS CATEGORY  3: Probably benign.

## 2017-10-21 IMAGING — US ULTRASOUND RIGHT BREAST LIMITED
1 series · 5 of 5 positions shown · non-contrast
Comparison: Previous exam(s).

CLINICAL DATA: 51-year-old female presenting for evaluation of
palpable lumps in the bilateral breasts.

EXAM:
DIGITAL DIAGNOSTIC BILATERAL MAMMOGRAM WITH CAD AND TOMO
BILATERAL BREAST ULTRASOUND

[Series 1: ultrasound right breast limited · 0.07mm/px · 5 of 5 slices shown]
[im 1/5]
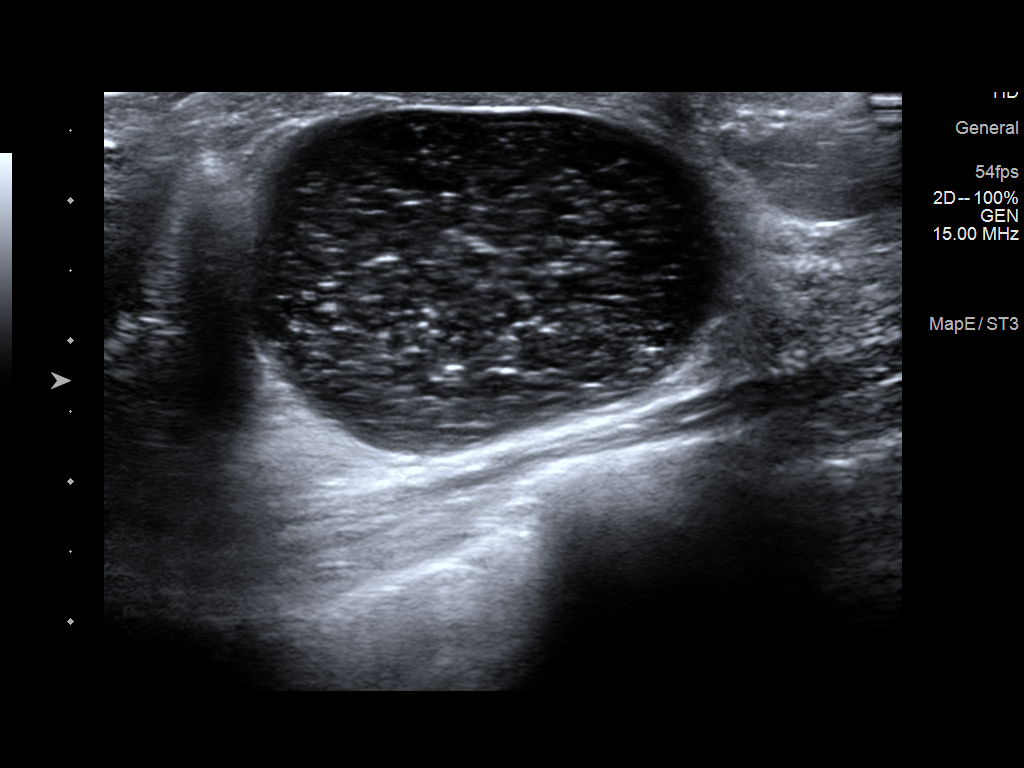
[im 2/5]
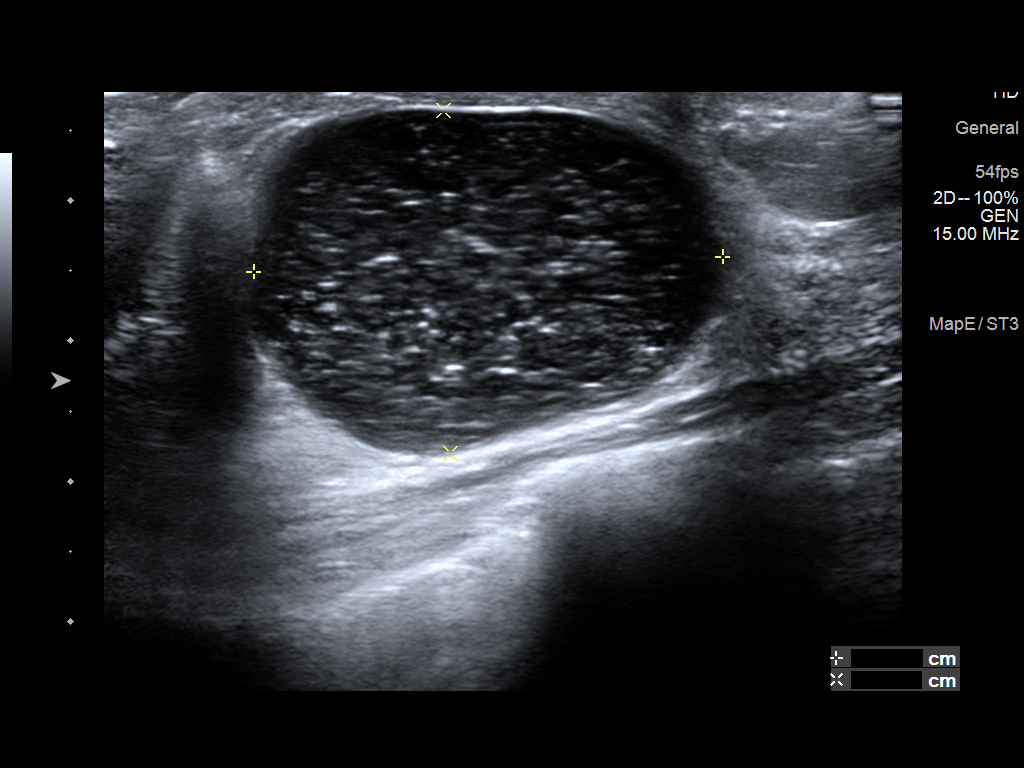
[im 3/5]
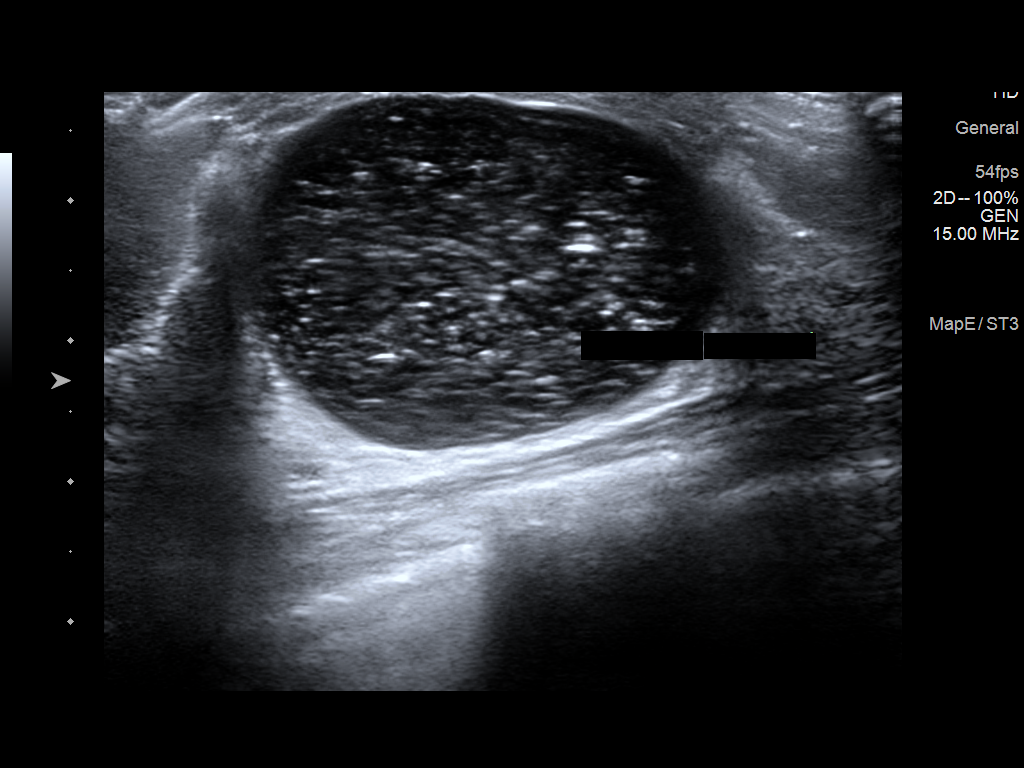
[im 4/5]
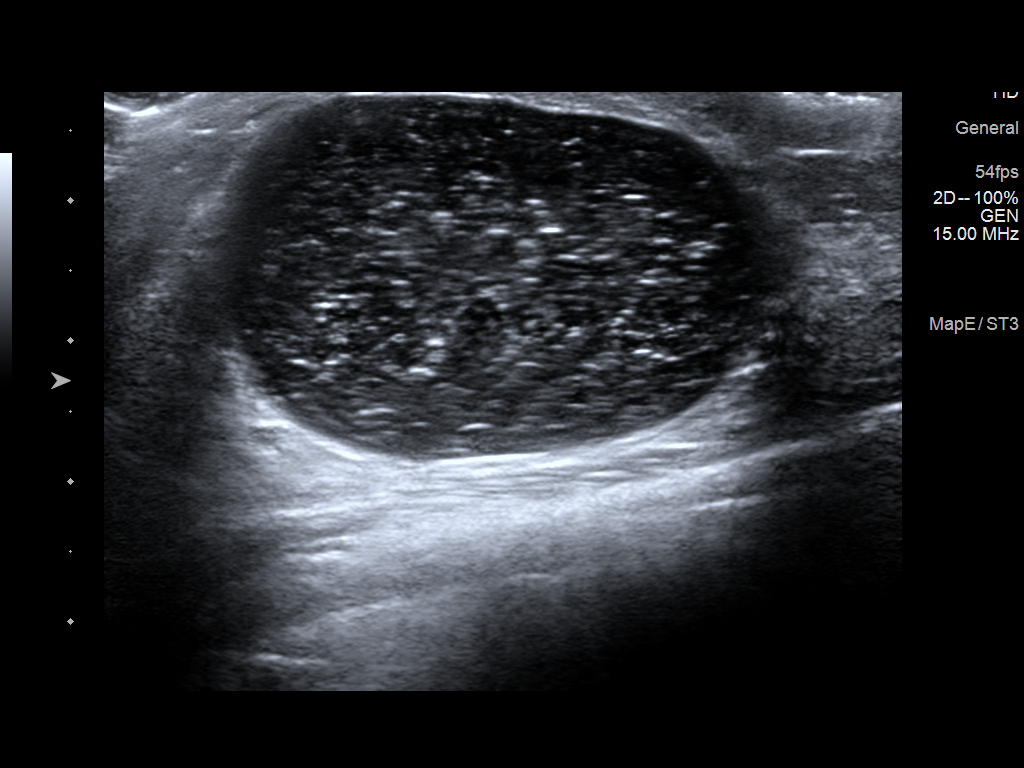
[im 5/5]
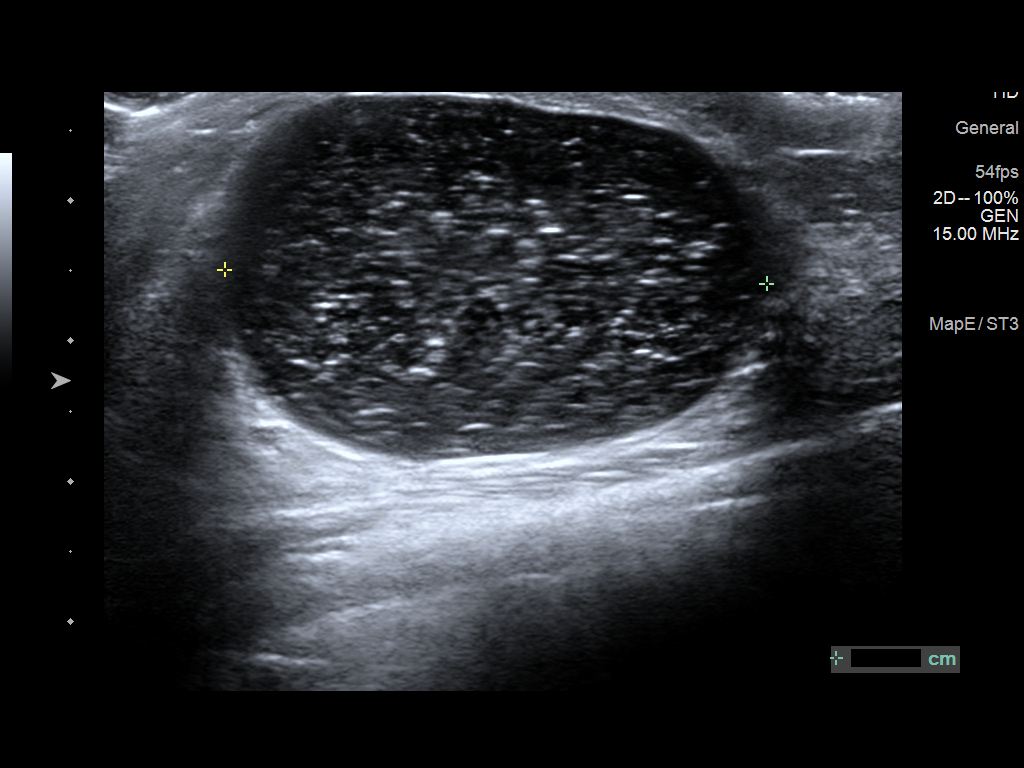

[5 of 5 positions shown; findings below may reference images not displayed]

ACR Breast Density Category c: The breast tissue is heterogeneously
dense, which may obscure small masses.
FINDINGS: A BB has been placed adjacent to the nipple on the right breast.
Deep to the marker there is a large oval circumscribed mass
measuring approximately 4 cm. In the lateral aspect of the left
breast another BB has been placed demonstrating a circumscribed oval
mass measuring approximately 1.7 cm. No other suspicious
calcifications, masses or areas of distortion are seen in the
bilateral breasts.

Mammographic images were processed with CAD.

Ultrasound targeted to the right breast at 9 o'clock, 1 cm from the
nipple demonstrates an oval circumscribed heterogeneously hypoechoic
mass measuring 3.9 x 2.4 x 3.3 cm. The heterogeneous material within
the mass swells and moves within the fluid, consistent with a
complicated cyst, and is therefore benign.

Ultrasound of the left breast at [DATE], 4 cm from the nipple
demonstrates a circumscribed anechoic oval mass measuring 1.7 x
x 1.4 cm, consistent with a cyst. Immediately adjacent to this is a
hypoechoic oval mass measuring 4 x 4 x 4 mm.
IMPRESSION: 1. The palpable masses in the bilateral breasts correspond with
benign cysts.

2. There is an incidentally noted hypoechoic mass adjacent to the
palpable mass in the left breast at [DATE]. This likely represents a
small complicated cyst.

3.  No mammographic evidence of malignancy in the bilateral breasts.

RECOMMENDATION:
1. Six-month follow-up left breast ultrasound is recommended for the
small probably benign mass at [DATE].

2. Aspiration was offered but declined by the patient at this time,
as she is asymptomatic.

I have discussed the findings and recommendations with the patient.
Results were also provided in writing at the conclusion of the
visit. If applicable, a reminder letter will be sent to the patient
regarding the next appointment.

BI-RADS CATEGORY  3: Probably benign.

## 2017-11-03 DIAGNOSIS — Z01419 Encounter for gynecological examination (general) (routine) without abnormal findings: Secondary | ICD-10-CM | POA: Diagnosis not present

## 2017-11-03 DIAGNOSIS — N390 Urinary tract infection, site not specified: Secondary | ICD-10-CM | POA: Diagnosis not present

## 2017-11-03 DIAGNOSIS — M629 Disorder of muscle, unspecified: Secondary | ICD-10-CM | POA: Diagnosis not present

## 2017-12-06 DIAGNOSIS — J209 Acute bronchitis, unspecified: Secondary | ICD-10-CM | POA: Diagnosis not present

## 2017-12-06 DIAGNOSIS — J0100 Acute maxillary sinusitis, unspecified: Secondary | ICD-10-CM | POA: Diagnosis not present

## 2017-12-10 DIAGNOSIS — J309 Allergic rhinitis, unspecified: Secondary | ICD-10-CM | POA: Diagnosis not present

## 2017-12-13 DIAGNOSIS — J069 Acute upper respiratory infection, unspecified: Secondary | ICD-10-CM | POA: Diagnosis not present

## 2018-04-21 ENCOUNTER — Other Ambulatory Visit: Payer: 59

## 2018-04-28 ENCOUNTER — Ambulatory Visit
Admission: RE | Admit: 2018-04-28 | Discharge: 2018-04-28 | Disposition: A | Discharge status: Home / Self Care | Payer: 59 | Source: Ambulatory Visit | Attending: Obstetrics and Gynecology | Admitting: Obstetrics and Gynecology

## 2018-04-28 ENCOUNTER — Other Ambulatory Visit: Payer: Self-pay | Admitting: Obstetrics and Gynecology

## 2018-04-28 DIAGNOSIS — N6320 Unspecified lump in the left breast, unspecified quadrant: Secondary | ICD-10-CM

## 2018-05-26 DIAGNOSIS — D485 Neoplasm of uncertain behavior of skin: Secondary | ICD-10-CM | POA: Diagnosis not present

## 2018-05-26 DIAGNOSIS — D225 Melanocytic nevi of trunk: Secondary | ICD-10-CM | POA: Diagnosis not present

## 2018-11-03 ENCOUNTER — Ambulatory Visit
Admission: RE | Admit: 2018-11-03 | Discharge: 2018-11-03 | Disposition: A | Discharge status: Home / Self Care | Payer: 59 | Source: Ambulatory Visit | Attending: Obstetrics and Gynecology | Admitting: Obstetrics and Gynecology

## 2018-11-03 ENCOUNTER — Other Ambulatory Visit: Payer: Self-pay | Admitting: Obstetrics and Gynecology

## 2018-11-03 DIAGNOSIS — N6320 Unspecified lump in the left breast, unspecified quadrant: Secondary | ICD-10-CM

## 2018-11-03 IMAGING — MG DIGITAL DIAGNOSTIC BILATERAL MAMMOGRAM WITH TOMO AND CAD
8 series · 8 of 24 positions shown · non-contrast
Comparison: Previous exam(s).

CLINICAL DATA: Annual examination and 1 year follow-up of probably
benign mass in the [DATE] position of the left breast.

EXAM:
DIGITAL DIAGNOSTIC BILATERAL MAMMOGRAM WITH CAD AND TOMO
ULTRASOUND LEFT BREAST

[L MLO synth-2D]
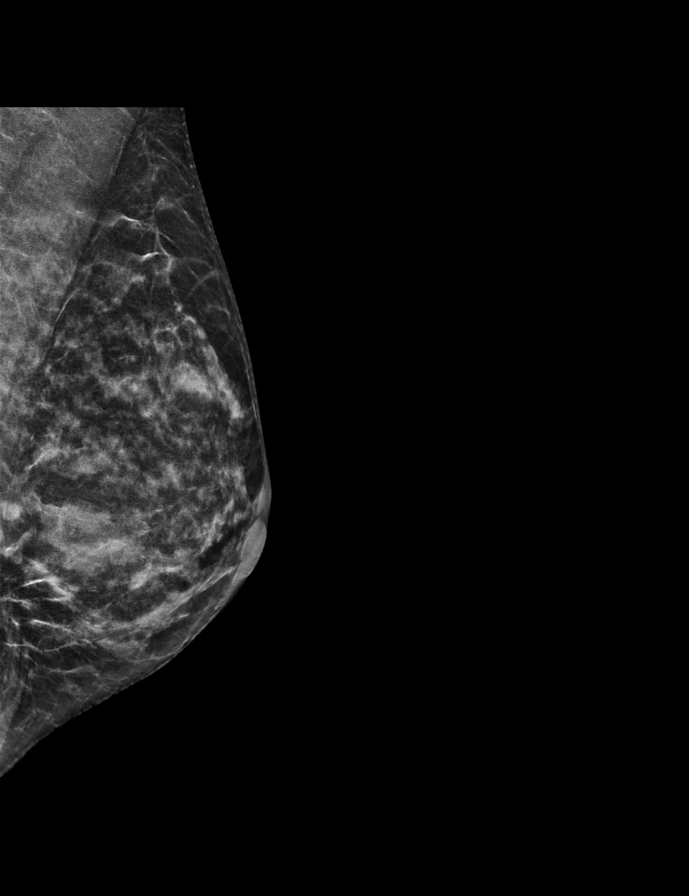

[R CC synth-2D]
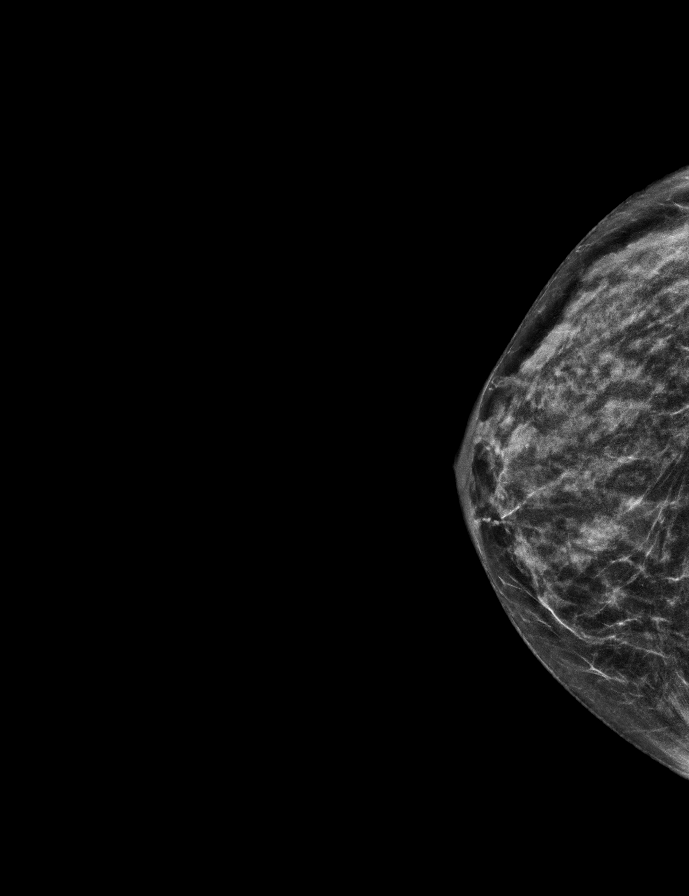

[L CC synth-2D]
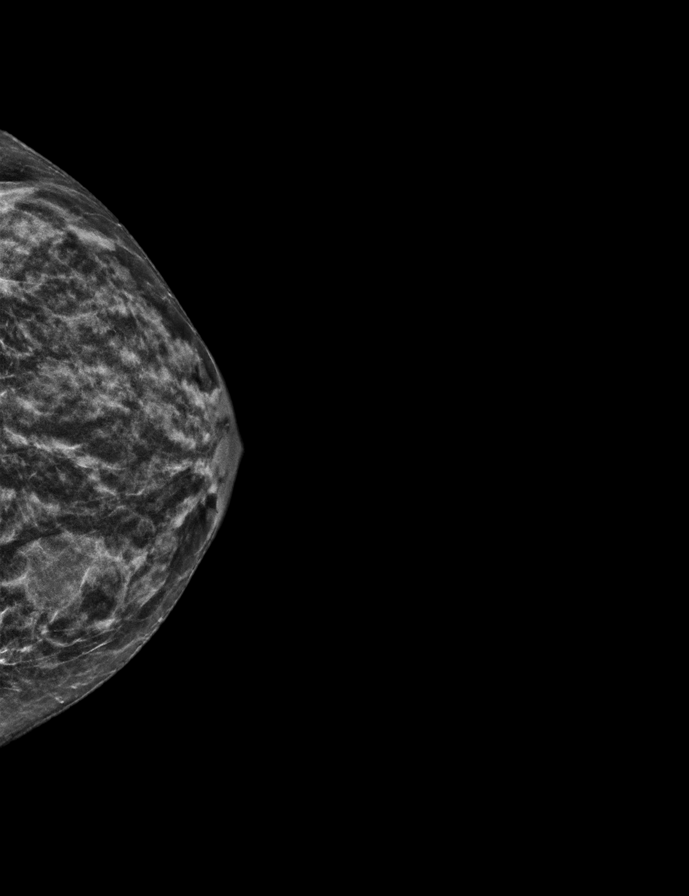

[R MLO synth-2D]
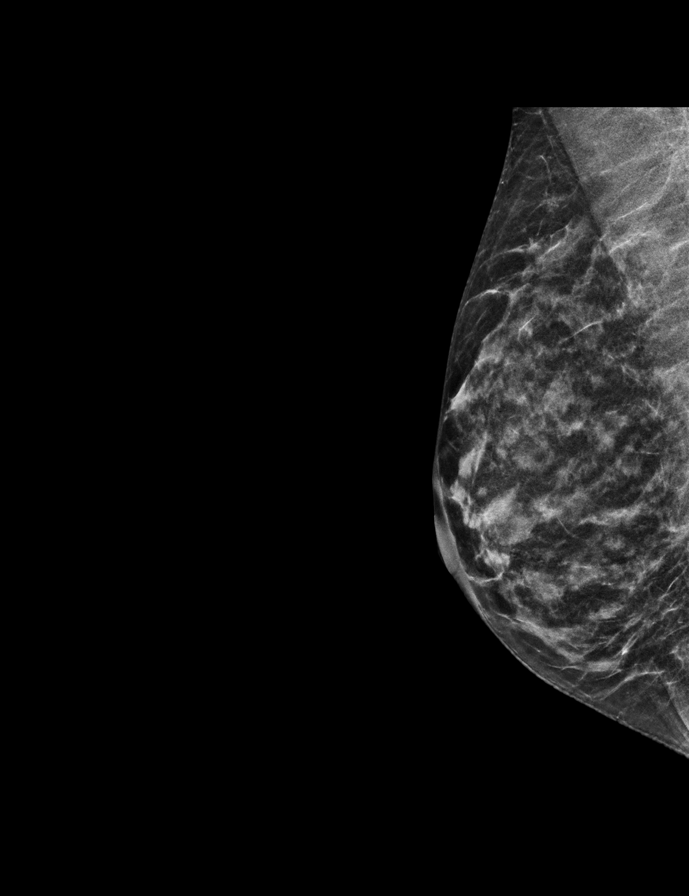

[R CC tomo · tomo slice 23/45.0]
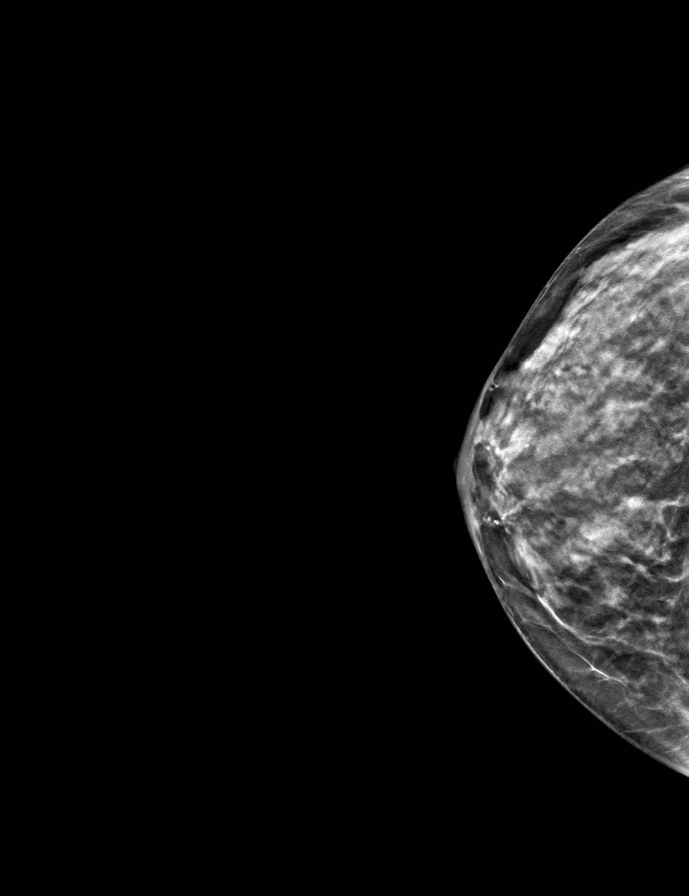

[L MLO tomo · tomo slice 23/44.0]
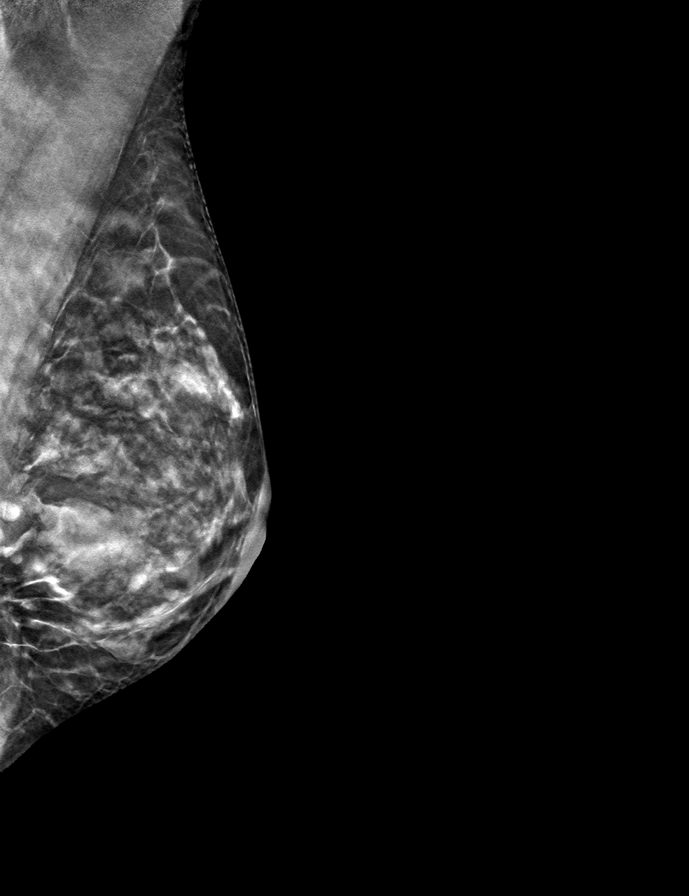

[R MLO tomo · tomo slice 22/43.0]
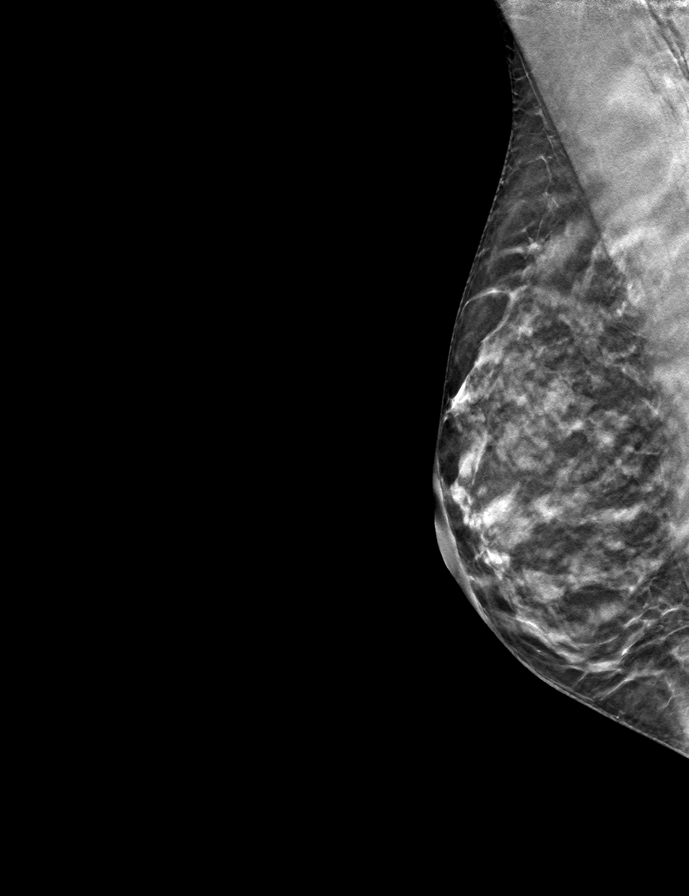

[L CC tomo · tomo slice 21/42.0]
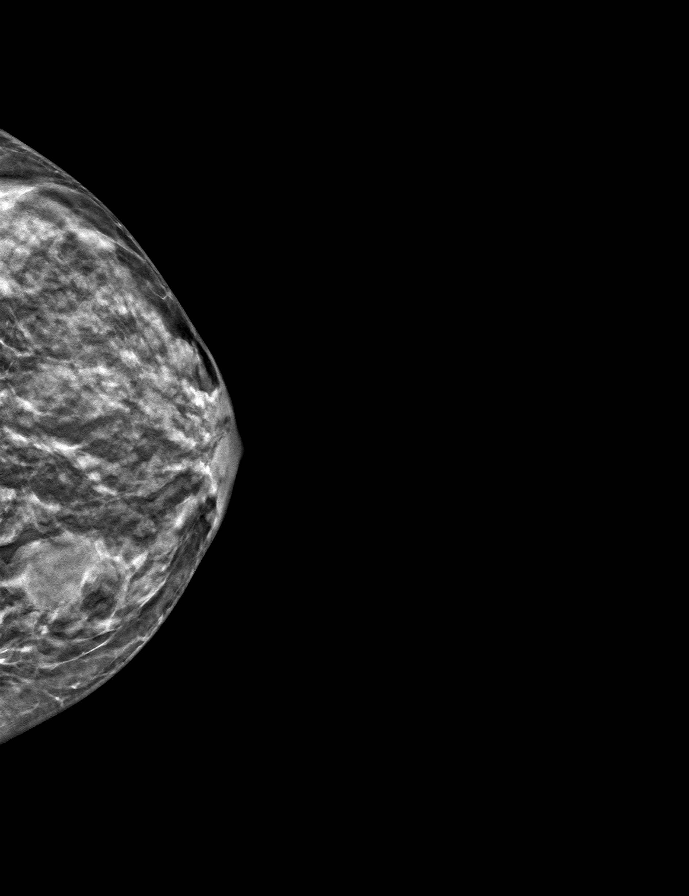

[8 of 24 positions shown; findings below may reference images not displayed]

ACR Breast Density Category c: The breast tissue is heterogeneously
dense, which may obscure small masses.
FINDINGS: Interval significant decrease/resolution in size of the retroareolar
right breast mass, shown to be a benign cyst in [DATE]. In
circumscribed oval mass in the medial left breast measuring
approximately 2 cm. No architectural distortion or suspicious
microcalcification in either breast.

Mammographic images were processed with CAD.

Targeted ultrasound is performed, showing an oval 1.9 x 1.6 x 0.7 cm
simple cyst 10 o'clock position left breast accounting for the
circumscribed mass seen medially on the mammogram.

The known mass at [DATE] position left breast 4 cm from the nipple is
stable in size measuring 3 x 4 x 3 mm. This is likely a complicated
cyst.
IMPRESSION: No evidence of malignancy in either breast.

Stable 3 x 4 x 3 mm mass in the [DATE] position of the left breast
with imaging features suggestive of complicated cyst. This has
remained stable for 1 year.

Simple cyst 10 o'clock position left breast.

RECOMMENDATION:
Screening mammogram in one year.(Code:[BI])

I have discussed the findings and recommendations with the patient.
Results were also provided in writing at the conclusion of the
visit. If applicable, a reminder letter will be sent to the patient
regarding the next appointment.

BI-RADS CATEGORY  2: Benign.

## 2018-11-03 IMAGING — US ULTRASOUND LEFT BREAST LIMITED
1 series · 13 of 15 positions shown · non-contrast
Comparison: Previous exam(s).

CLINICAL DATA: Annual examination and 1 year follow-up of probably
benign mass in the [DATE] position of the left breast.

EXAM:
DIGITAL DIAGNOSTIC BILATERAL MAMMOGRAM WITH CAD AND TOMO
ULTRASOUND LEFT BREAST

[Series 1: ultrasound left breast limited · 0.06mm/px · 13 of 15 slices shown]
[im 1/15]
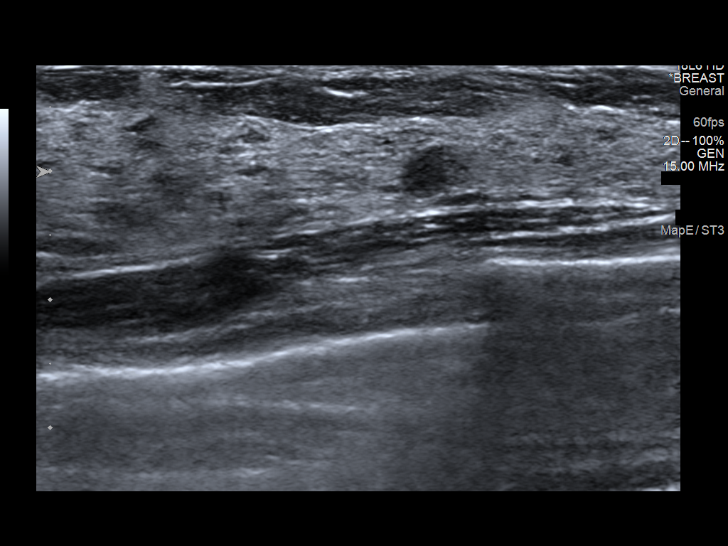
[im 2/15]
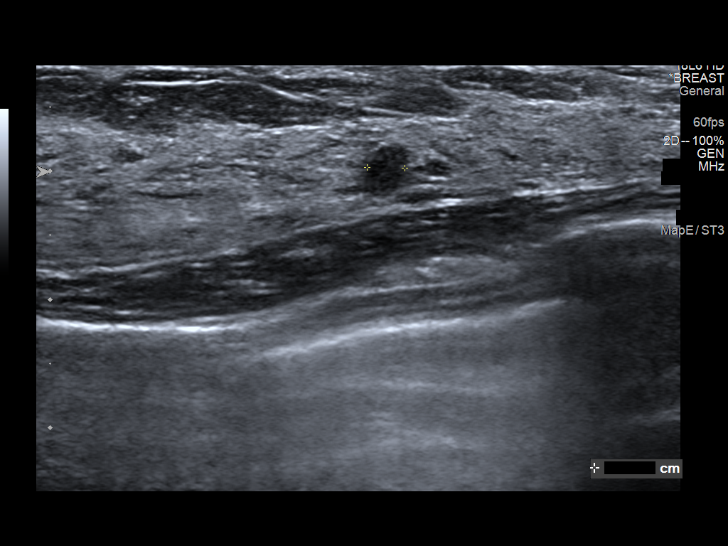
[im 3/15]
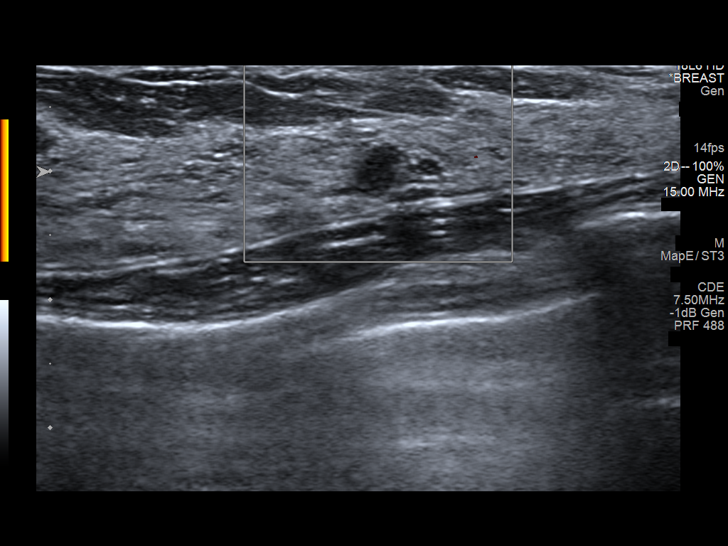
[im 5/15]
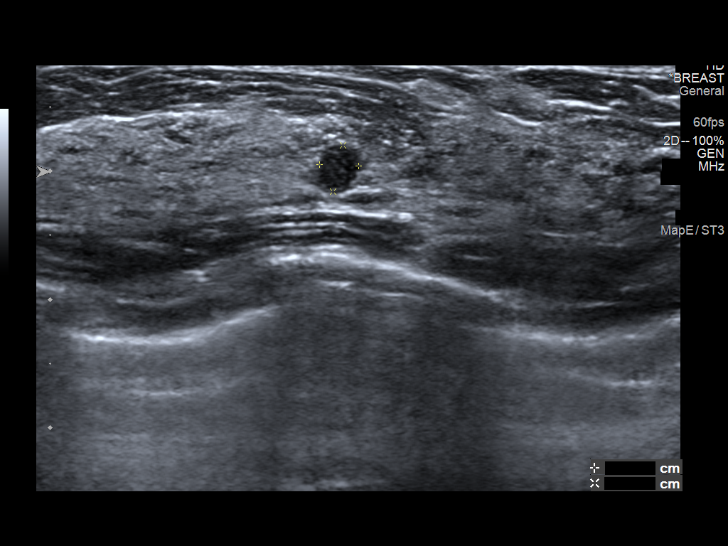
[im 6/15]
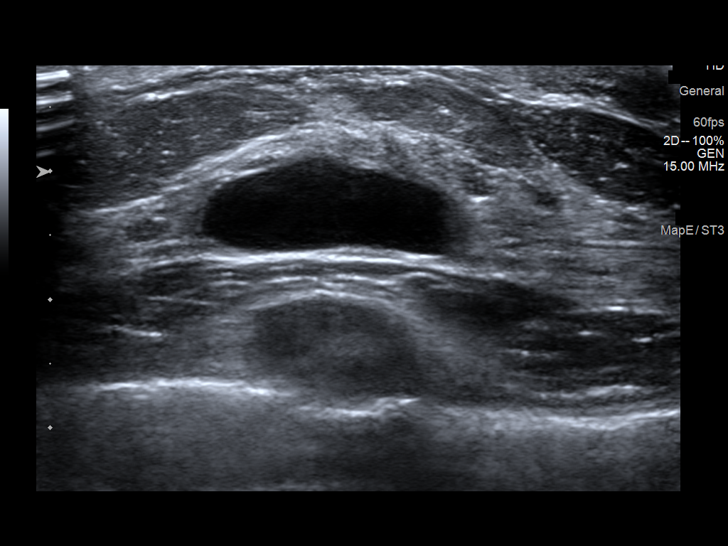
[im 7/15]
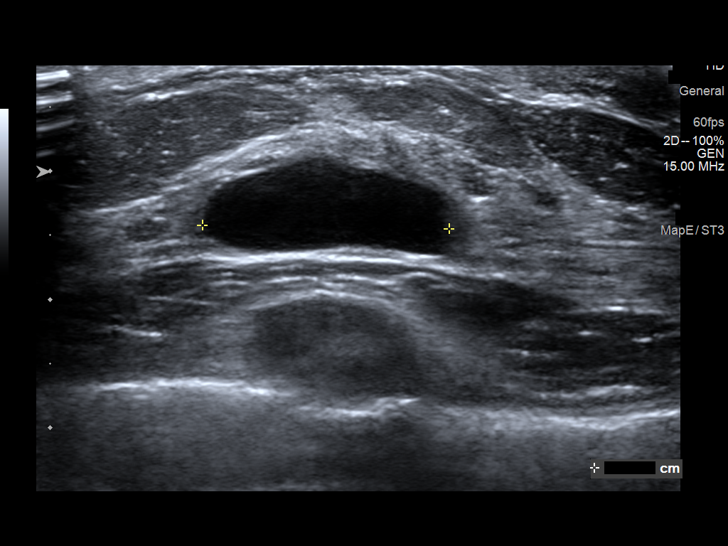
[im 8/15]
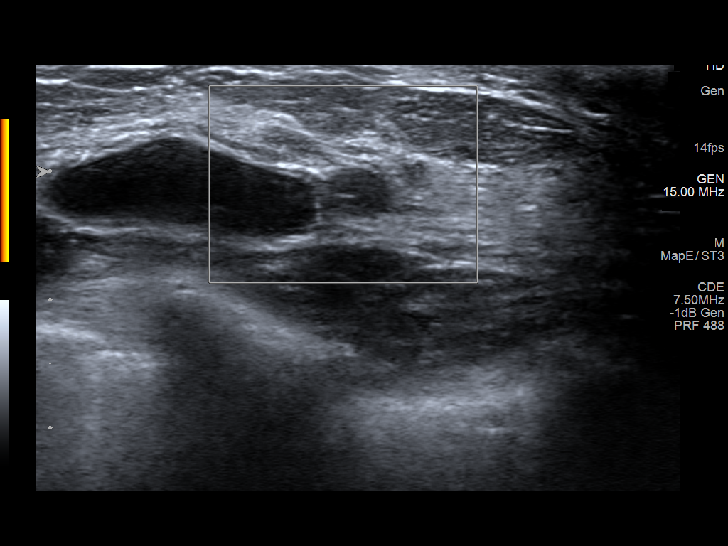
[im 9/15]
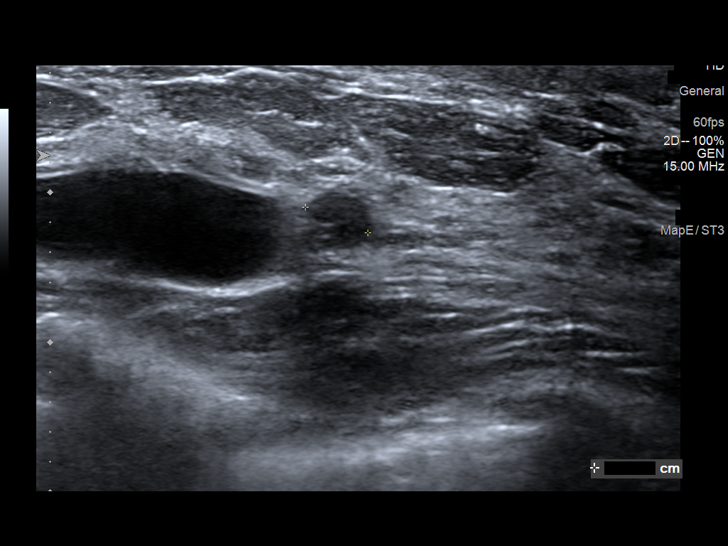
[im 10/15]
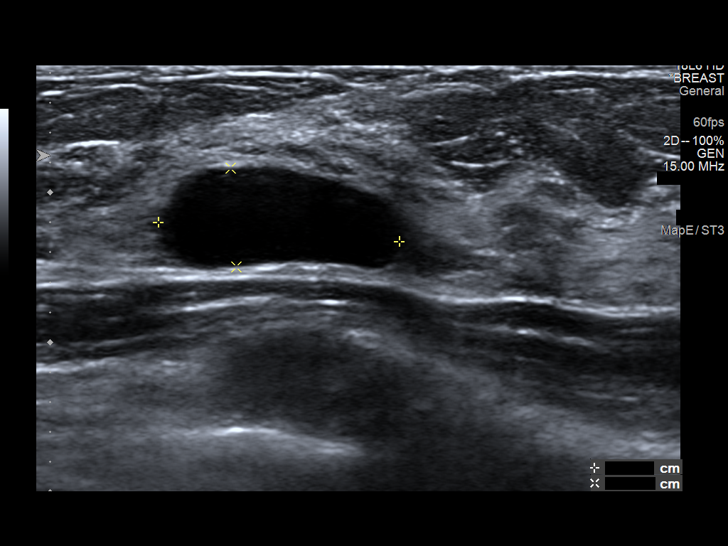
[im 11/15]
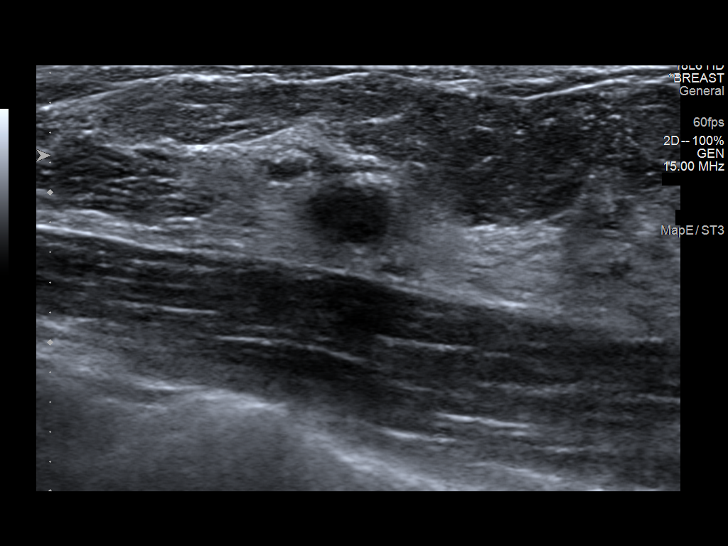
[im 13/15]
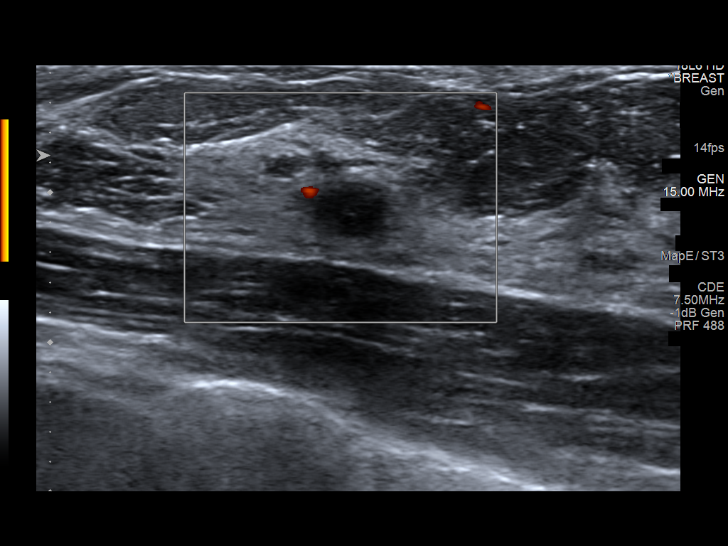
[im 14/15]
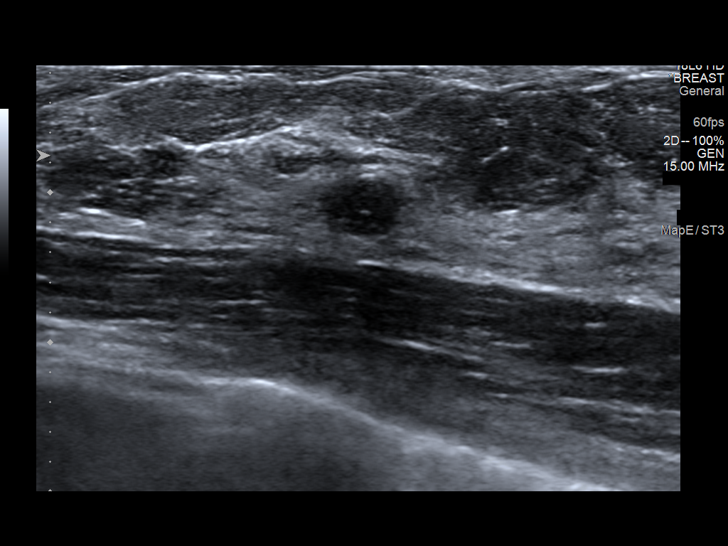
[im 15/15]
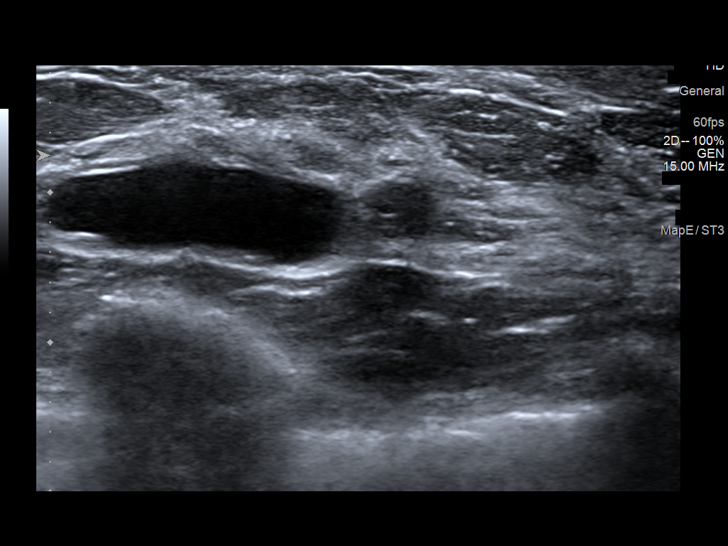

[13 of 15 positions shown; findings below may reference images not displayed]

ACR Breast Density Category c: The breast tissue is heterogeneously
dense, which may obscure small masses.
FINDINGS: Interval significant decrease/resolution in size of the retroareolar
right breast mass, shown to be a benign cyst in [DATE]. In
circumscribed oval mass in the medial left breast measuring
approximately 2 cm. No architectural distortion or suspicious
microcalcification in either breast.

Mammographic images were processed with CAD.

Targeted ultrasound is performed, showing an oval 1.9 x 1.6 x 0.7 cm
simple cyst 10 o'clock position left breast accounting for the
circumscribed mass seen medially on the mammogram.

The known mass at [DATE] position left breast 4 cm from the nipple is
stable in size measuring 3 x 4 x 3 mm. This is likely a complicated
cyst.
IMPRESSION: No evidence of malignancy in either breast.

Stable 3 x 4 x 3 mm mass in the [DATE] position of the left breast
with imaging features suggestive of complicated cyst. This has
remained stable for 1 year.

Simple cyst 10 o'clock position left breast.

RECOMMENDATION:
Screening mammogram in one year.(Code:[BI])

I have discussed the findings and recommendations with the patient.
Results were also provided in writing at the conclusion of the
visit. If applicable, a reminder letter will be sent to the patient
regarding the next appointment.

BI-RADS CATEGORY  2: Benign.

## 2019-10-12 ENCOUNTER — Other Ambulatory Visit: Payer: Self-pay | Admitting: Obstetrics and Gynecology

## 2019-10-12 DIAGNOSIS — Z1231 Encounter for screening mammogram for malignant neoplasm of breast: Secondary | ICD-10-CM

## 2019-11-22 ENCOUNTER — Other Ambulatory Visit: Payer: Self-pay

## 2019-11-22 ENCOUNTER — Ambulatory Visit
Admission: RE | Admit: 2019-11-22 | Discharge: 2019-11-22 | Disposition: A | Discharge status: Home / Self Care | Payer: 59 | Source: Ambulatory Visit | Attending: Obstetrics and Gynecology | Admitting: Obstetrics and Gynecology

## 2019-11-22 DIAGNOSIS — Z1231 Encounter for screening mammogram for malignant neoplasm of breast: Secondary | ICD-10-CM

## 2019-11-22 IMAGING — MG DIGITAL SCREENING BILAT W/ TOMO W/ CAD
8 series · 9 of 24 positions shown · non-contrast
Comparison: Previous exam(s).

CLINICAL DATA: Screening.

EXAM:
DIGITAL SCREENING BILATERAL MAMMOGRAM WITH TOMO AND CAD

[R MLO synth-2D]
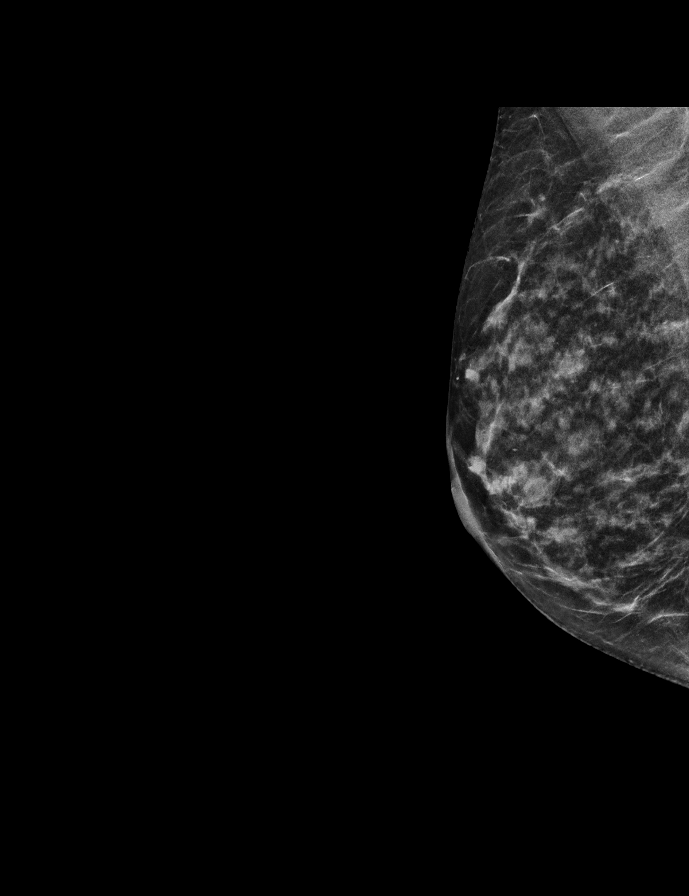

[L MLO synth-2D]
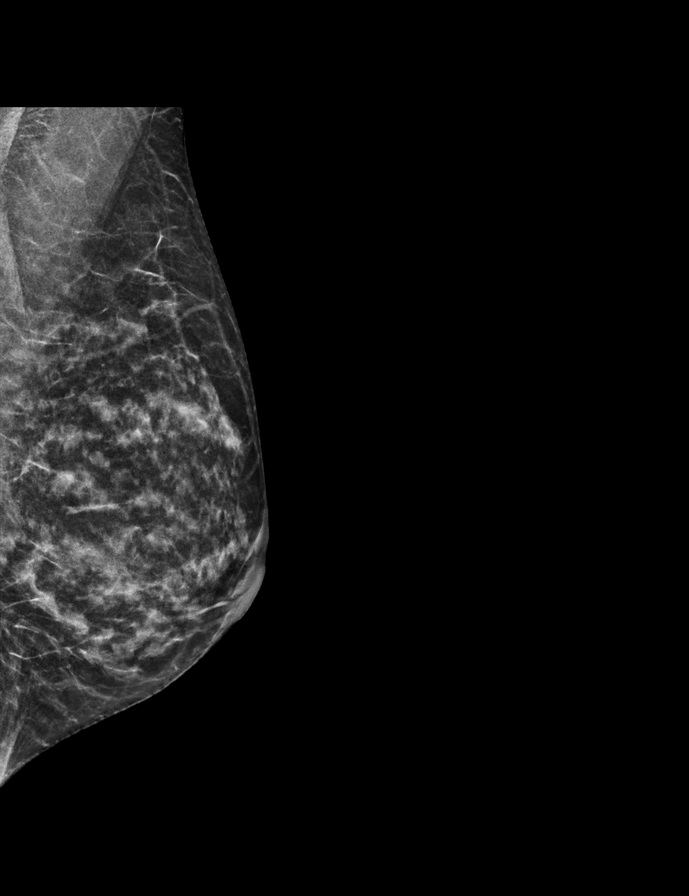

[R CC synth-2D]
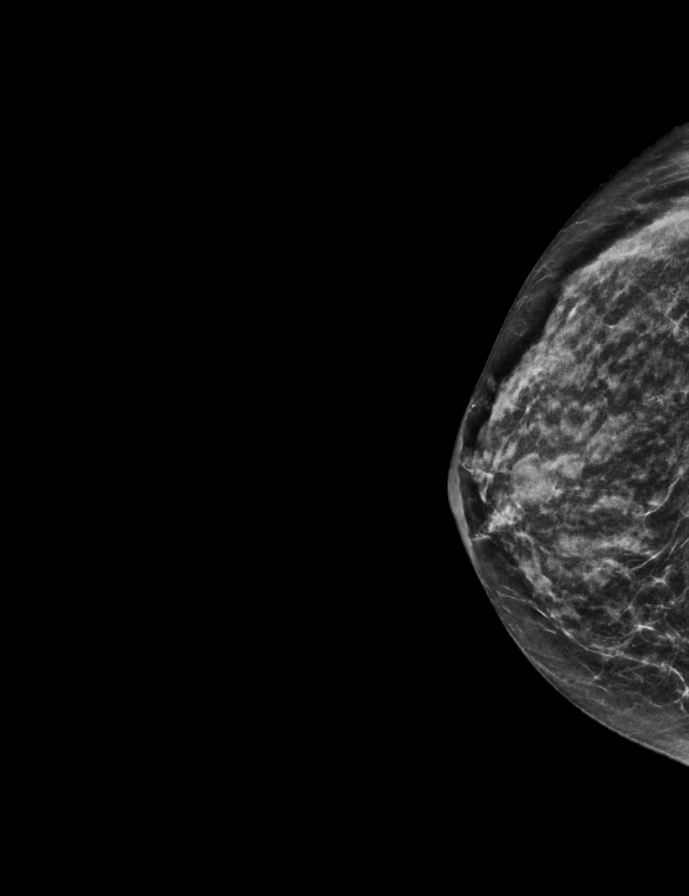

[L CC synth-2D]
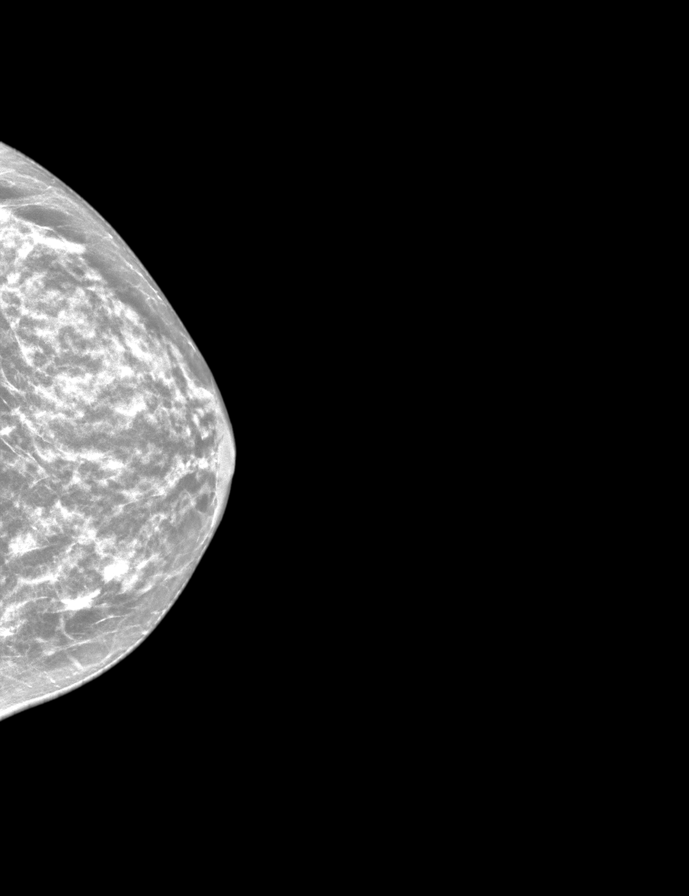

[L CC tomo · 2 of 46 frames shown]
[frame 15/46]
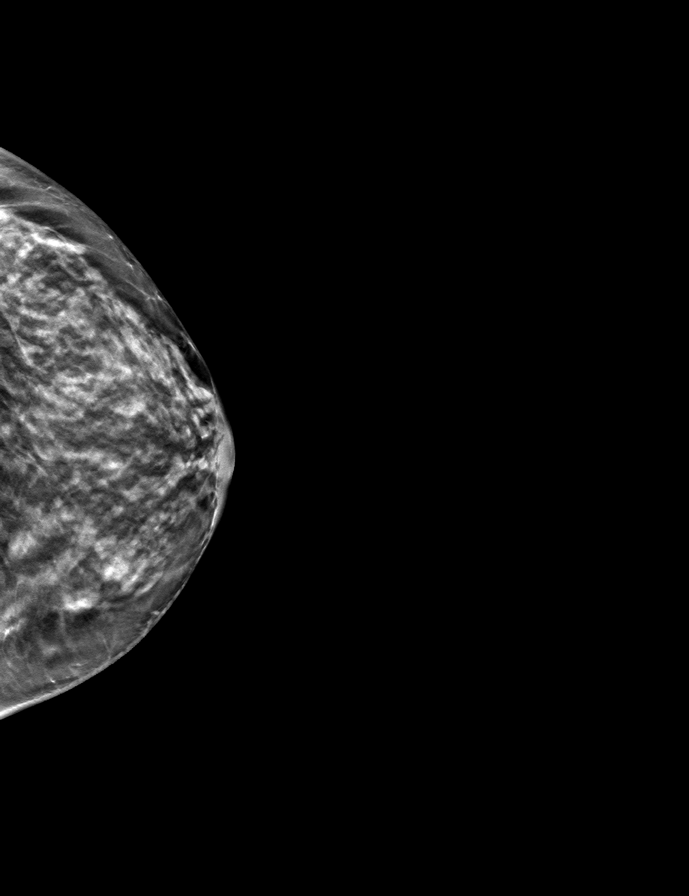
[frame 23/46]
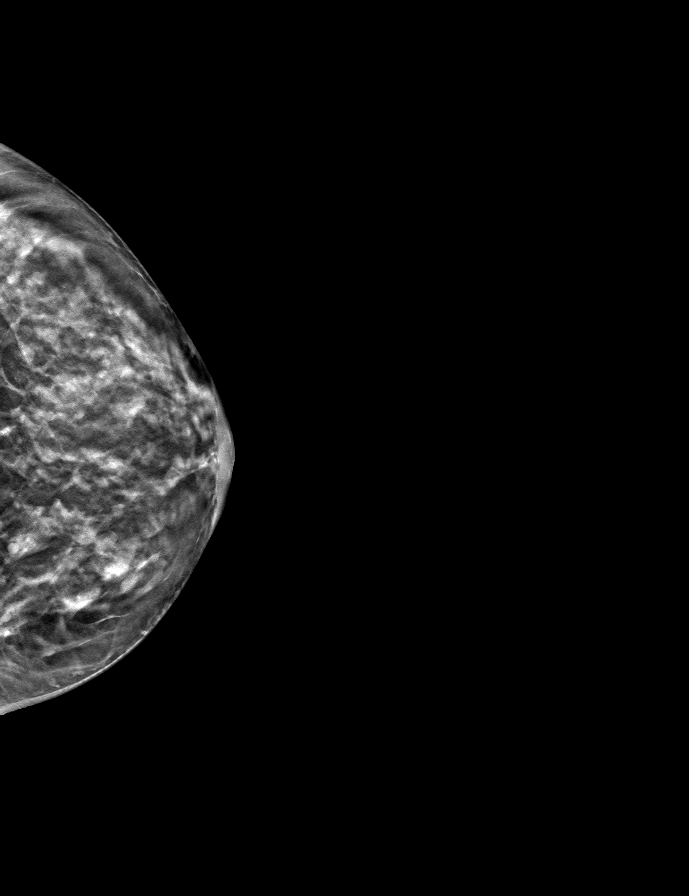

[L MLO tomo · tomo slice 22/43.0]
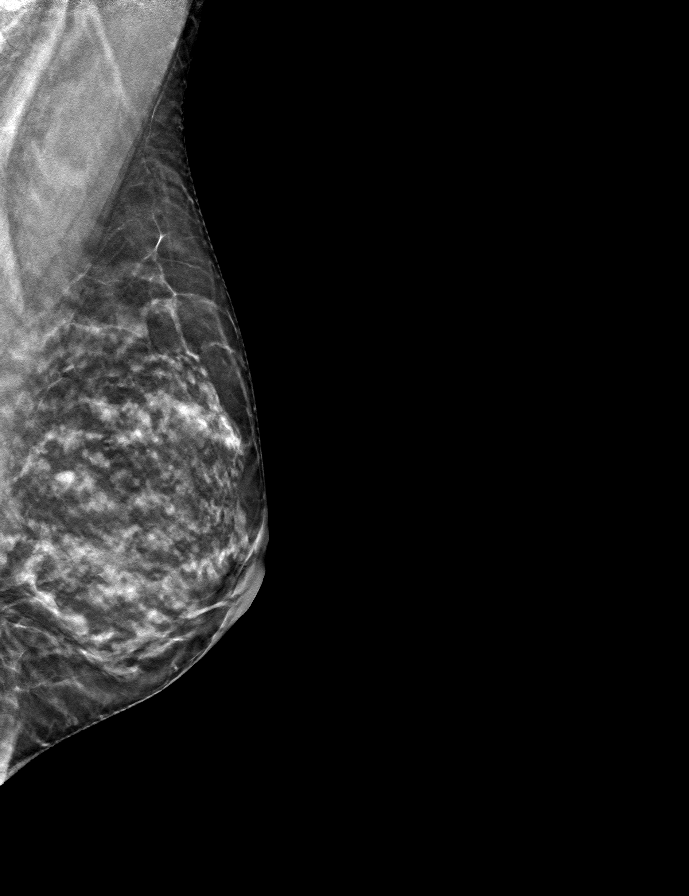

[R CC tomo · tomo slice 21/42.0]
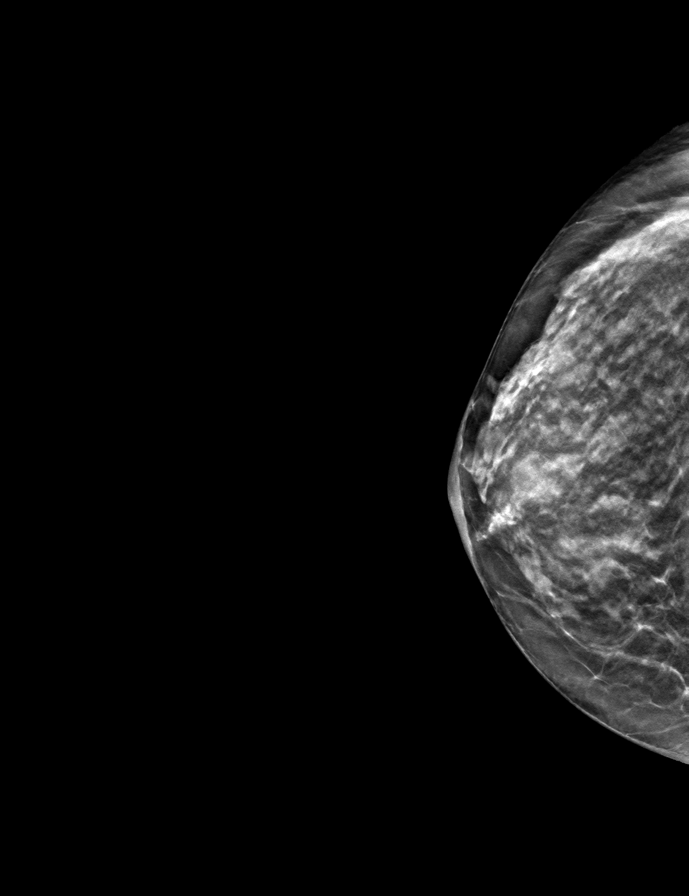

[R MLO tomo · tomo slice 23/44.0]
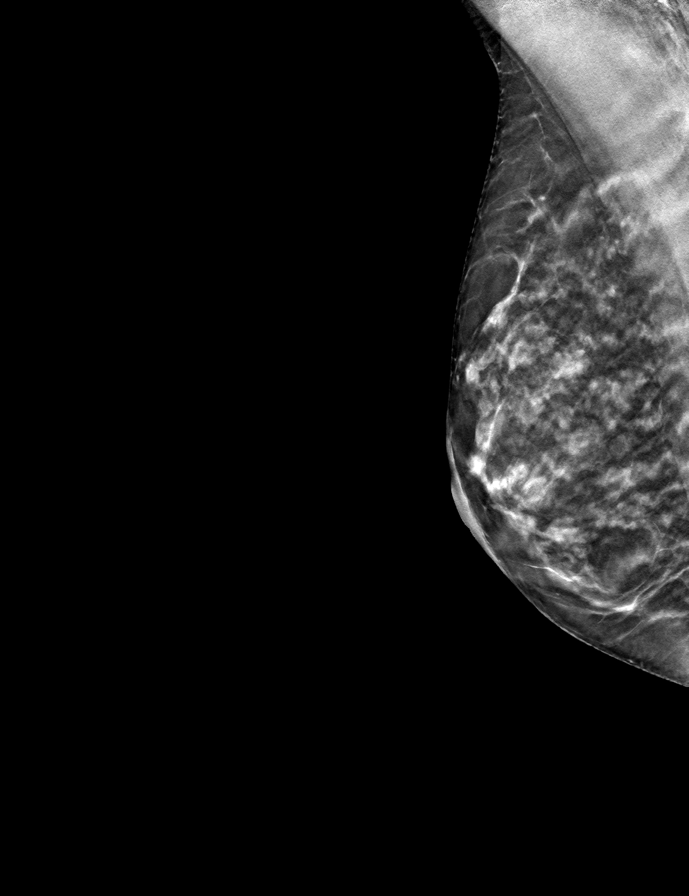

[9 of 24 positions shown; findings below may reference images not displayed]

ACR Breast Density Category c: The breast tissue is heterogeneously
dense, which may obscure small masses.
FINDINGS: There are no findings suspicious for malignancy. Images were
processed with CAD.
IMPRESSION: No mammographic evidence of malignancy. A result letter of this
screening mammogram will be mailed directly to the patient.

RECOMMENDATION:
Screening mammogram in one year. (Code:[5V])

BI-RADS CATEGORY  1: Negative.

## 2019-11-23 ENCOUNTER — Ambulatory Visit: Payer: 59

## 2020-10-03 ENCOUNTER — Other Ambulatory Visit: Payer: Self-pay | Admitting: Physician Assistant

## 2020-10-03 ENCOUNTER — Ambulatory Visit
Admission: RE | Admit: 2020-10-03 | Discharge: 2020-10-03 | Disposition: A | Discharge status: Home / Self Care | Payer: 59 | Source: Ambulatory Visit | Attending: Physician Assistant | Admitting: Physician Assistant

## 2020-10-03 DIAGNOSIS — Z8616 Personal history of COVID-19: Secondary | ICD-10-CM

## 2020-10-03 DIAGNOSIS — R053 Chronic cough: Secondary | ICD-10-CM

## 2020-10-03 IMAGING — CR DG CHEST 2V
2 series · 2 of 2 positions shown · non-contrast
Comparison: [DATE]

CLINICAL DATA: Persistent cough

EXAM:
CHEST - 2 VIEW

[w chest pa]
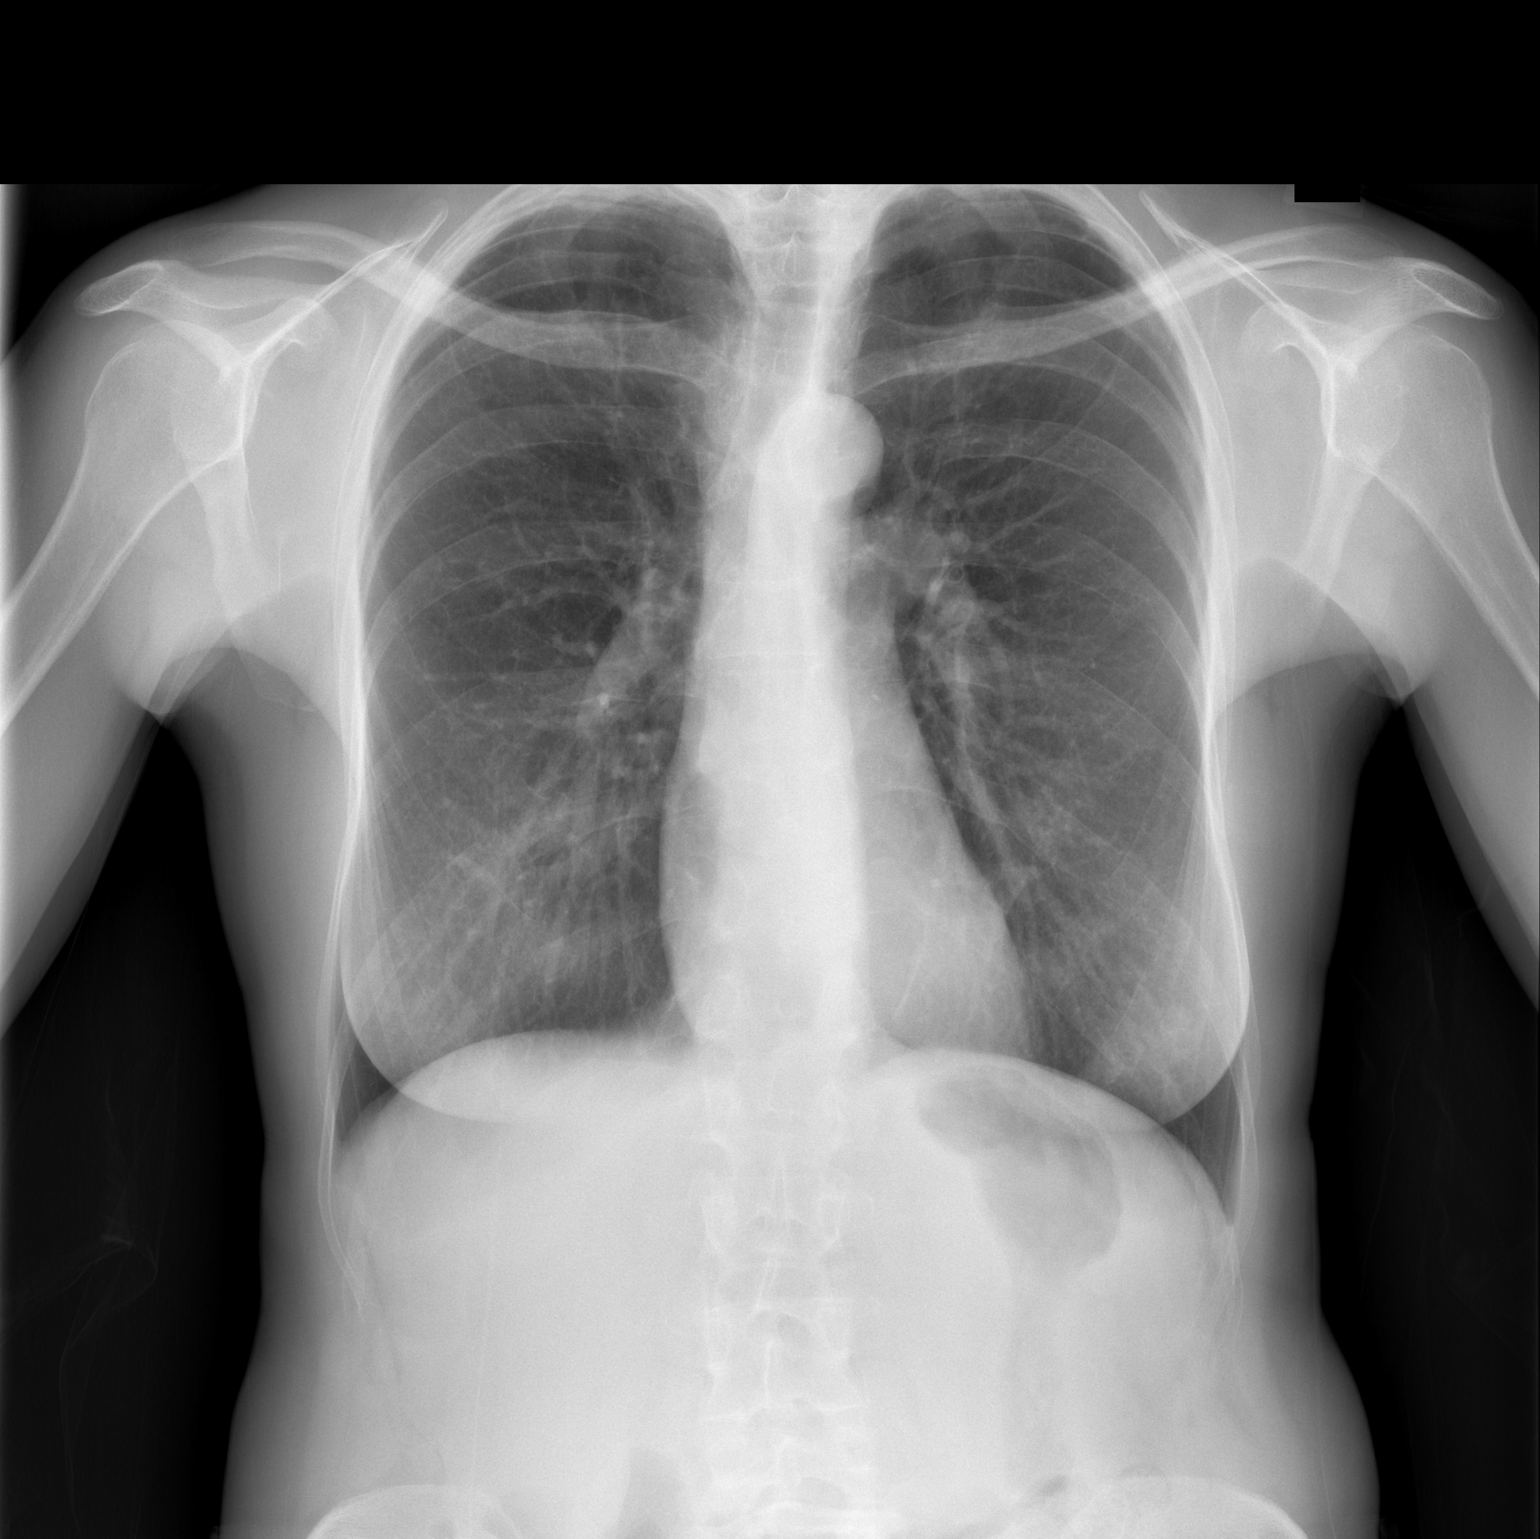

[w chest lat]
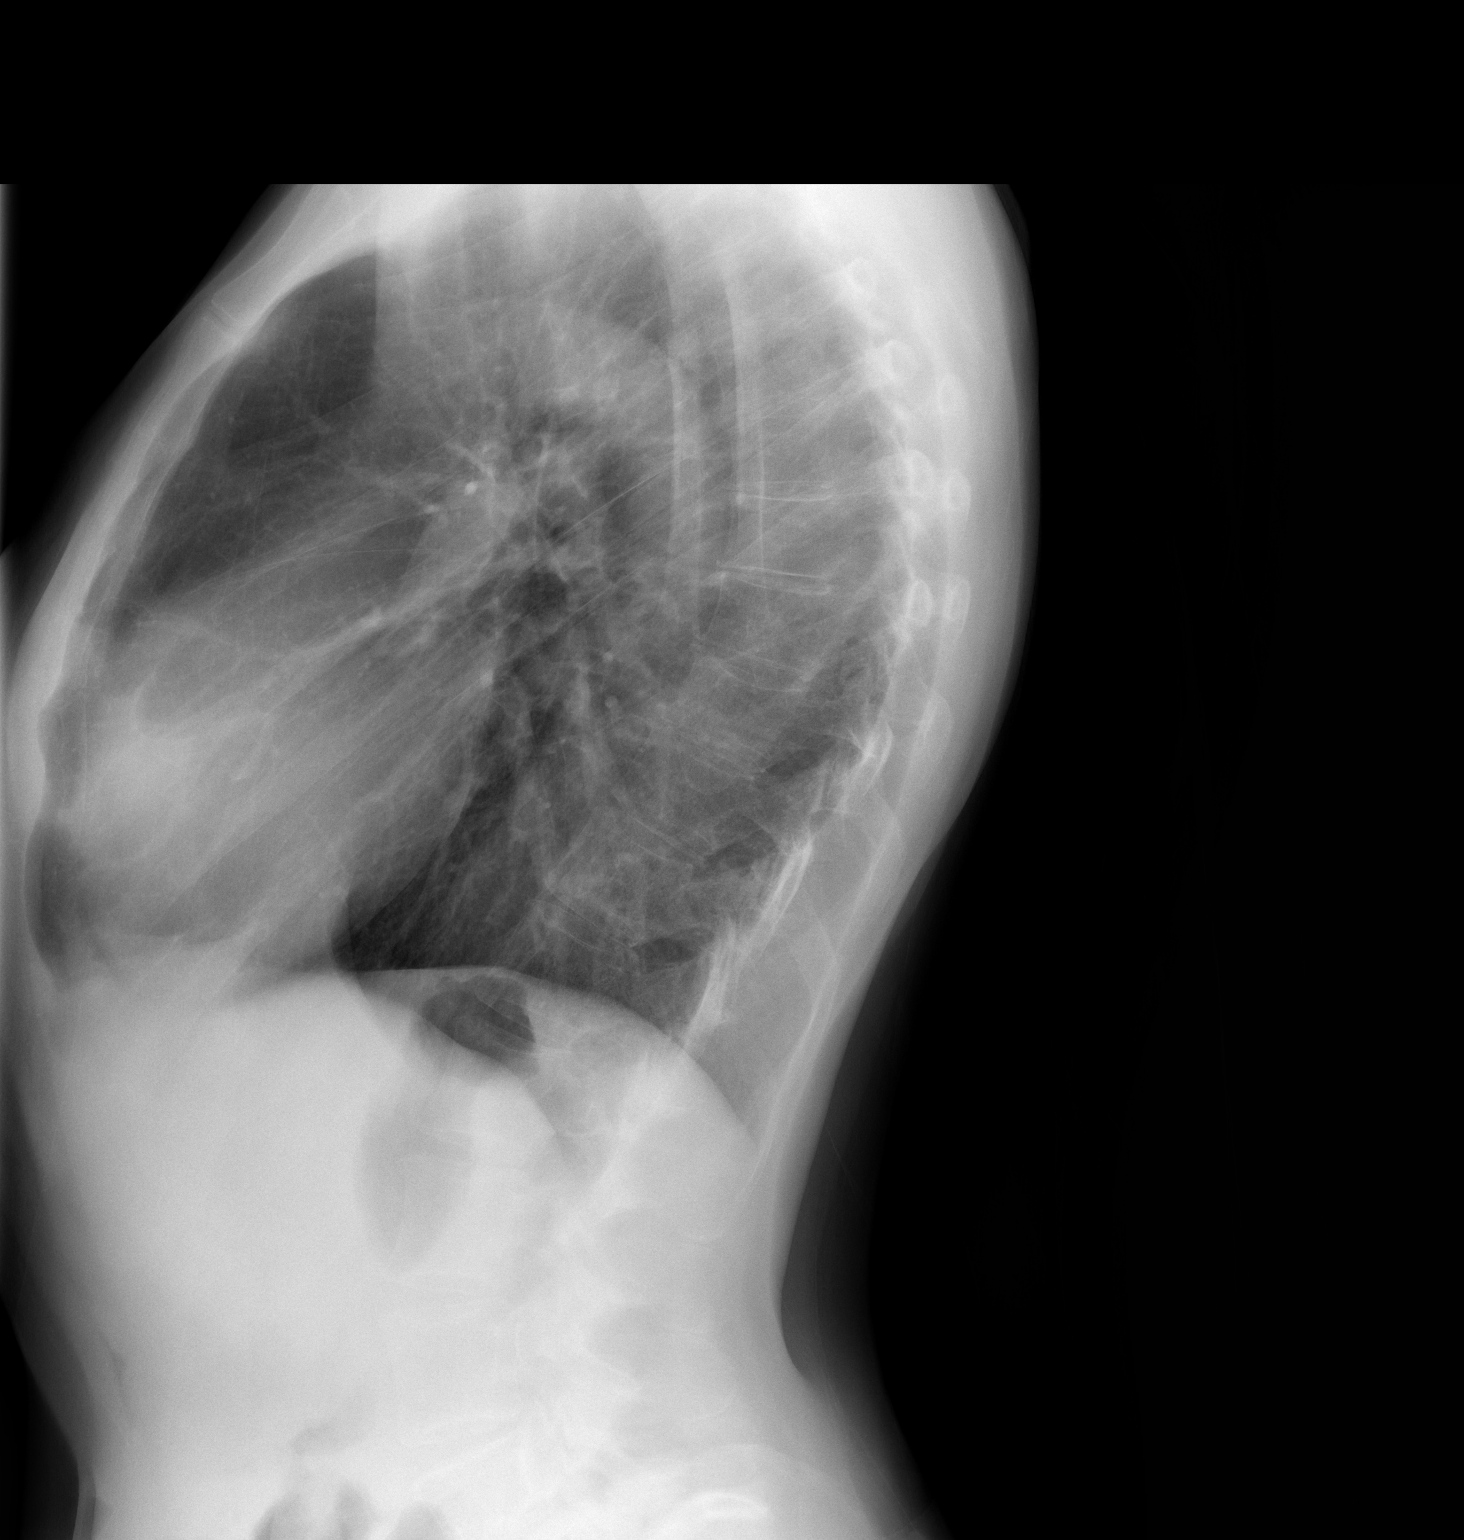

[2 of 2 positions shown; findings below may reference images not displayed]

FINDINGS: The heart size and mediastinal contours are within normal limits.
Both lungs are clear. The visualized skeletal structures are
unremarkable.
IMPRESSION: No acute abnormality of the lungs.

## 2020-10-21 ENCOUNTER — Other Ambulatory Visit: Payer: Self-pay | Admitting: Obstetrics and Gynecology

## 2020-10-21 DIAGNOSIS — Z1231 Encounter for screening mammogram for malignant neoplasm of breast: Secondary | ICD-10-CM

## 2020-12-12 ENCOUNTER — Other Ambulatory Visit: Payer: Self-pay

## 2020-12-12 ENCOUNTER — Ambulatory Visit
Admission: RE | Admit: 2020-12-12 | Discharge: 2020-12-12 | Disposition: A | Discharge status: Home / Self Care | Payer: 59 | Source: Ambulatory Visit | Attending: Obstetrics and Gynecology | Admitting: Obstetrics and Gynecology

## 2020-12-12 DIAGNOSIS — Z1231 Encounter for screening mammogram for malignant neoplasm of breast: Secondary | ICD-10-CM

## 2020-12-12 IMAGING — MG MM DIGITAL SCREENING BILAT W/ TOMO AND CAD
8 series · 9 of 24 positions shown · non-contrast
Comparison: Previous exam(s).

CLINICAL DATA: Screening.

EXAM:
DIGITAL SCREENING BILATERAL MAMMOGRAM WITH TOMOSYNTHESIS AND CAD
TECHNIQUE: Bilateral screening digital craniocaudal and mediolateral oblique
mammograms were obtained. Bilateral screening digital breast
tomosynthesis was performed. The images were evaluated with
computer-aided detection.

[L CC synth-2D]
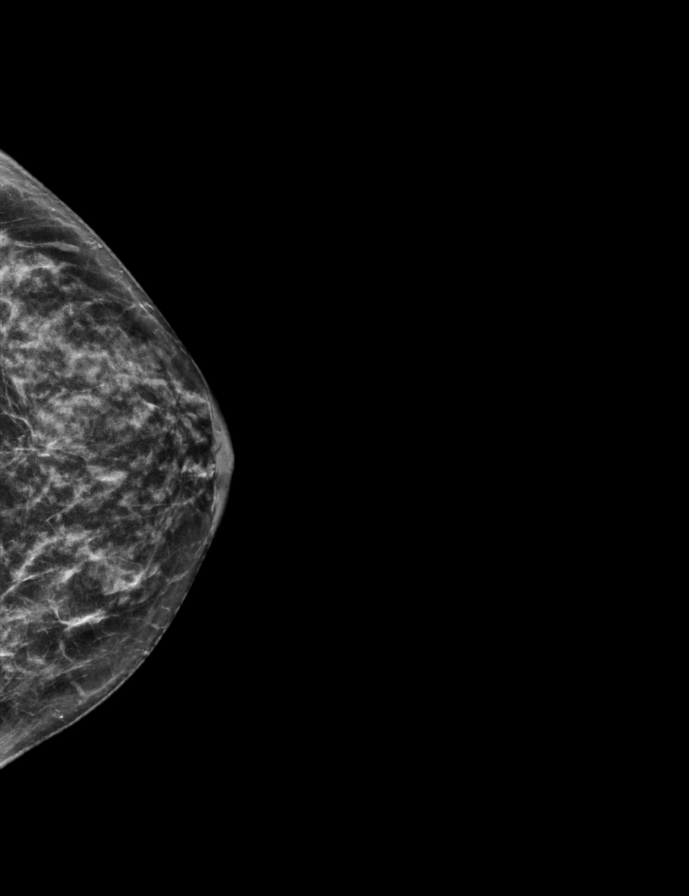

[L MLO synth-2D]
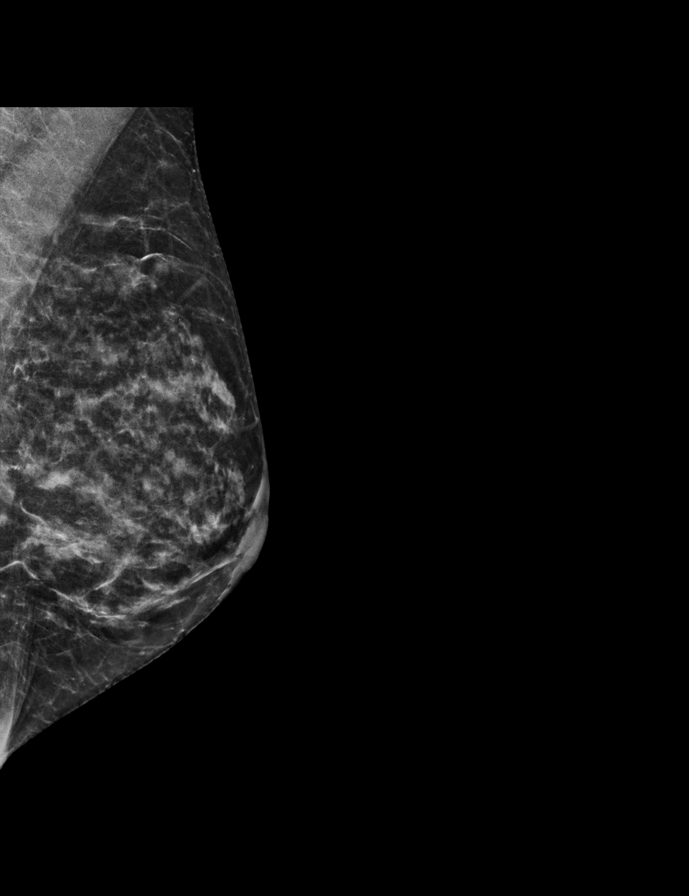

[R MLO synth-2D]
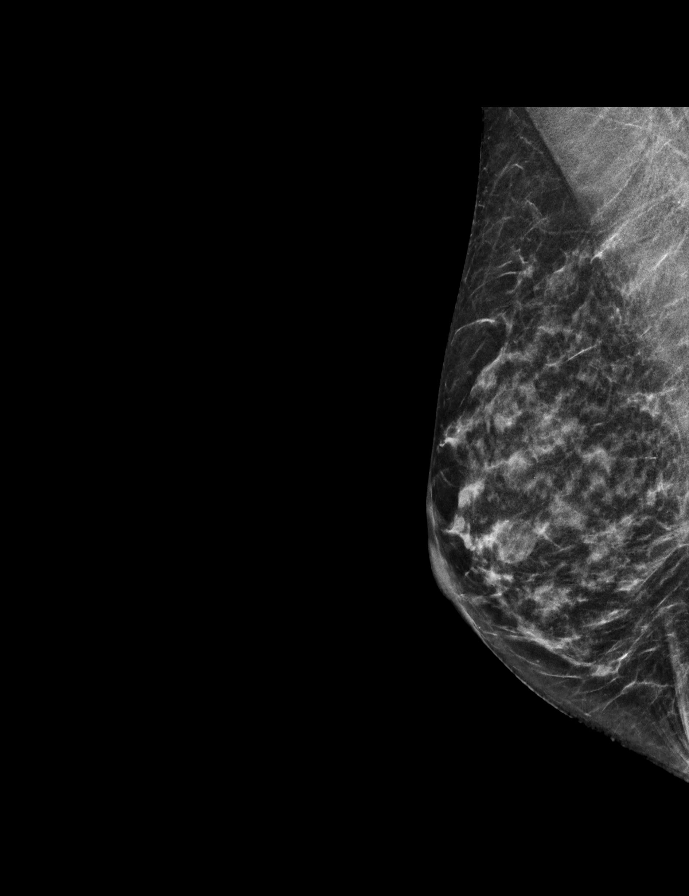

[R CC synth-2D]
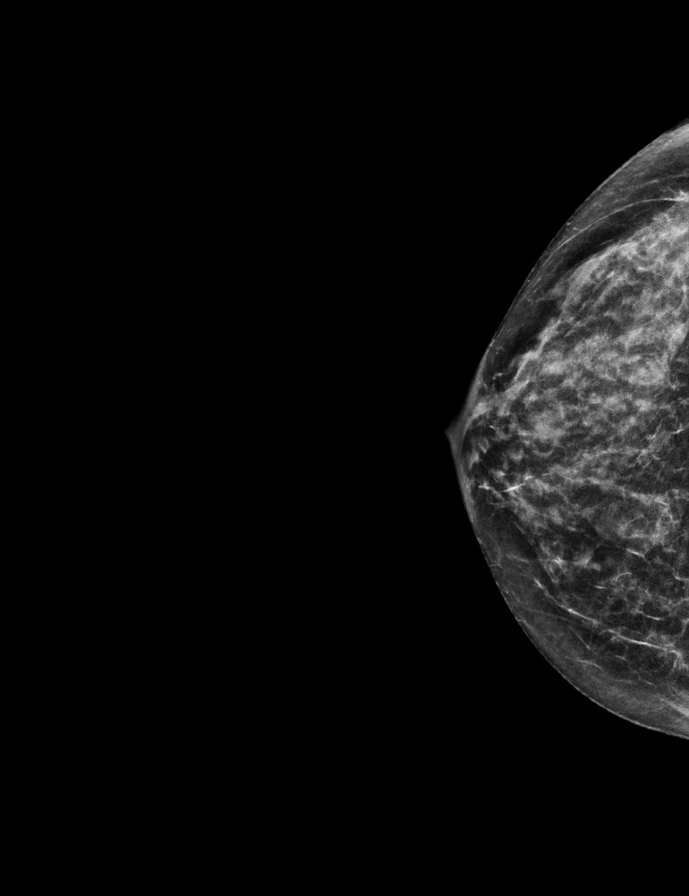

[R CC tomo · 2 of 47 frames shown]
[frame 16/47]
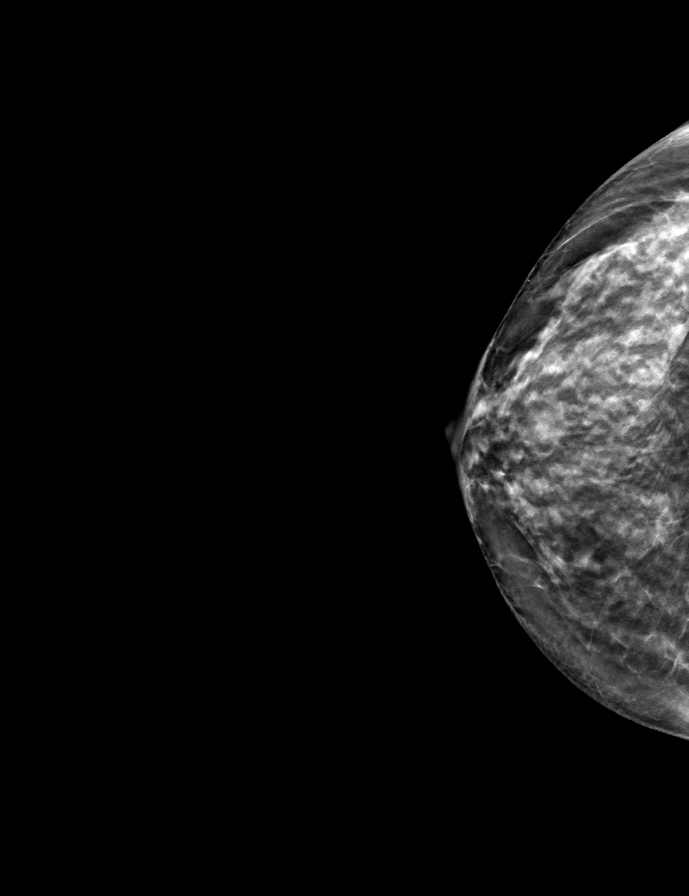
[frame 24/47]
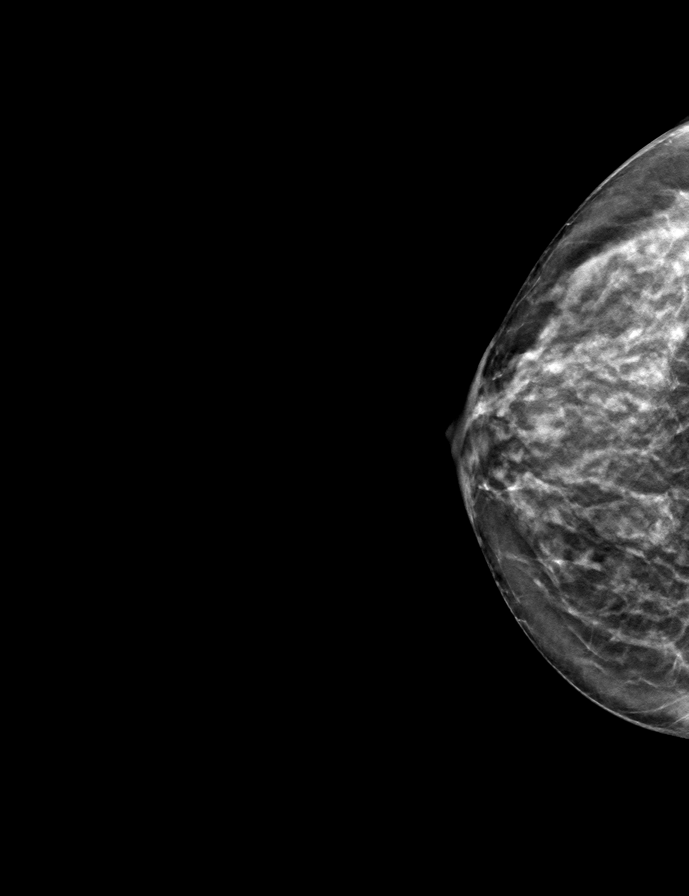

[R MLO tomo · tomo slice 24/47.0]
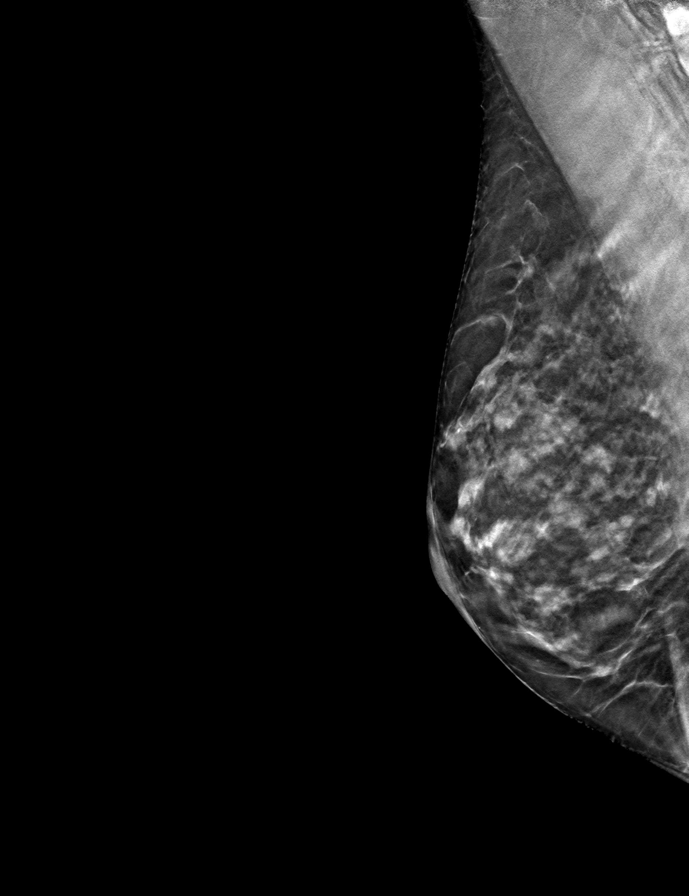

[L CC tomo · tomo slice 26/51.0]
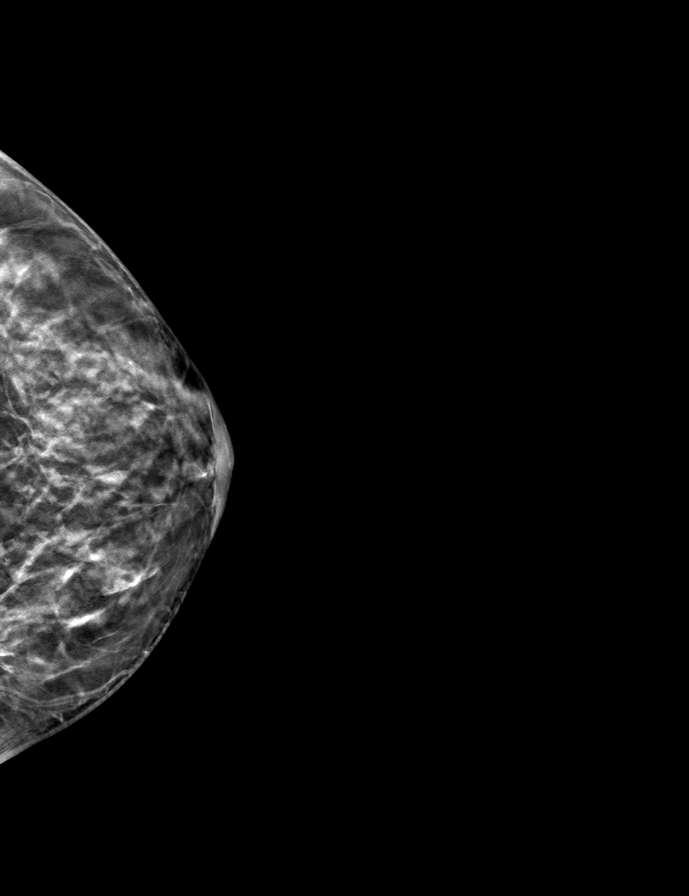

[L MLO tomo · tomo slice 25/48.0]
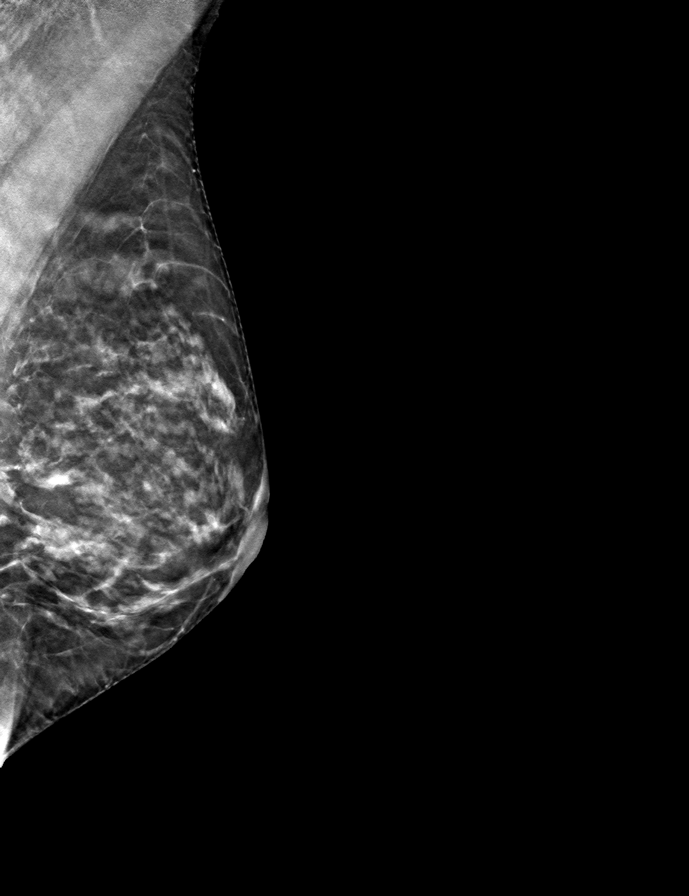

[9 of 24 positions shown; findings below may reference images not displayed]

ACR Breast Density Category c: The breast tissue is heterogeneously
dense, which may obscure small masses.
FINDINGS: There are no findings suspicious for malignancy. The images were
evaluated with computer-aided detection.
IMPRESSION: No mammographic evidence of malignancy. A result letter of this
screening mammogram will be mailed directly to the patient.

RECOMMENDATION:
Screening mammogram in one year. (Code:[6J])

BI-RADS CATEGORY  1: Negative.

## 2021-01-20 ENCOUNTER — Other Ambulatory Visit: Payer: Self-pay | Admitting: Sports Medicine

## 2021-01-20 DIAGNOSIS — M25572 Pain in left ankle and joints of left foot: Secondary | ICD-10-CM

## 2021-01-30 ENCOUNTER — Ambulatory Visit
Admission: RE | Admit: 2021-01-30 | Discharge: 2021-01-30 | Disposition: A | Discharge status: Home / Self Care | Payer: 59 | Source: Ambulatory Visit | Attending: Sports Medicine | Admitting: Sports Medicine

## 2021-01-30 ENCOUNTER — Other Ambulatory Visit: Payer: Self-pay

## 2021-01-30 DIAGNOSIS — M25572 Pain in left ankle and joints of left foot: Secondary | ICD-10-CM

## 2021-01-30 IMAGING — MR MR ANKLE*L* W/O CM
4 of 5 series · 19 of 40 positions shown · non-contrast
Comparison: None.

CLINICAL DATA: Left ankle pain swelling. No known injury.

EXAM:
MRI OF THE LEFT ANKLE WITHOUT CONTRAST
TECHNIQUE: Multiplanar, multisequence MR imaging of the ankle was performed. No
intravenous contrast was administered.

[Series 3: PD fat-sat · axial · left · 3.0mm · 0.31mm/px · z∈[-21,+94]mm · 8 of 30 slices shown]
[im 1/30]
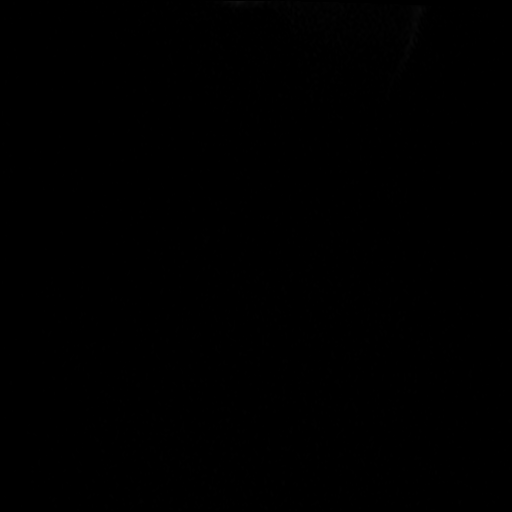
[im 5/30]
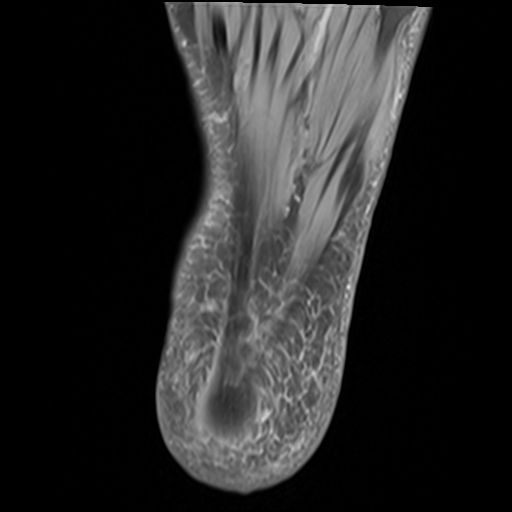
[im 9/30]
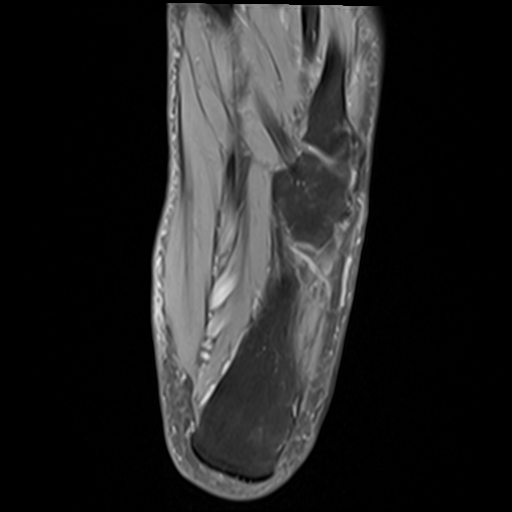
[im 13/30]
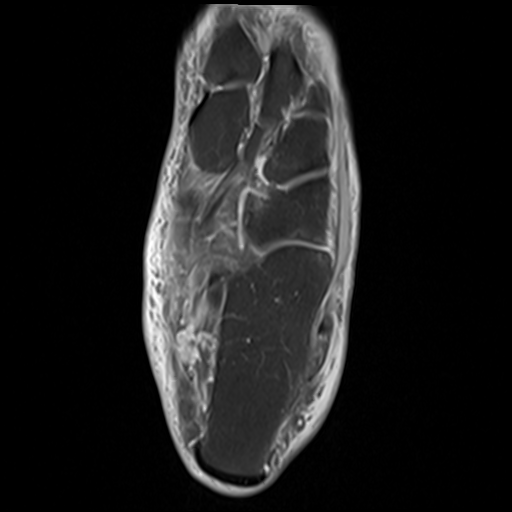
[im 17/30]
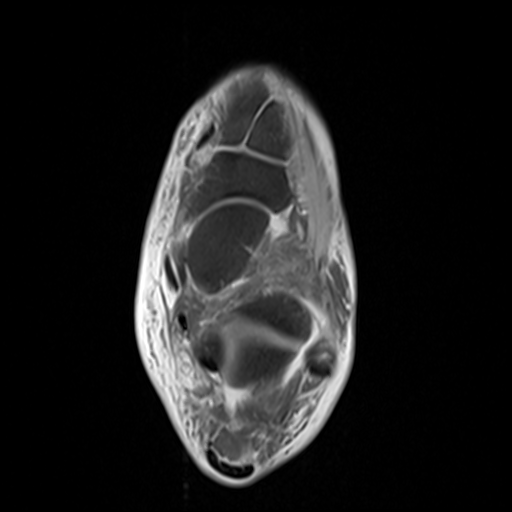
[im 21/30]
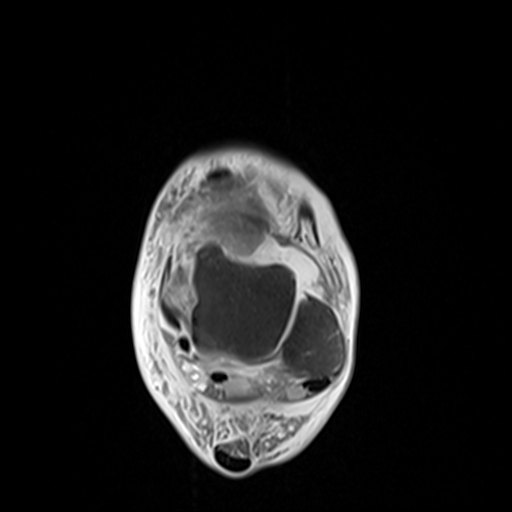
[im 25/30]
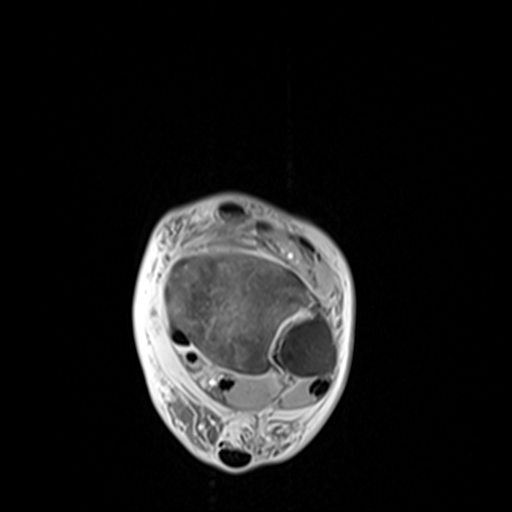
[im 30/30]
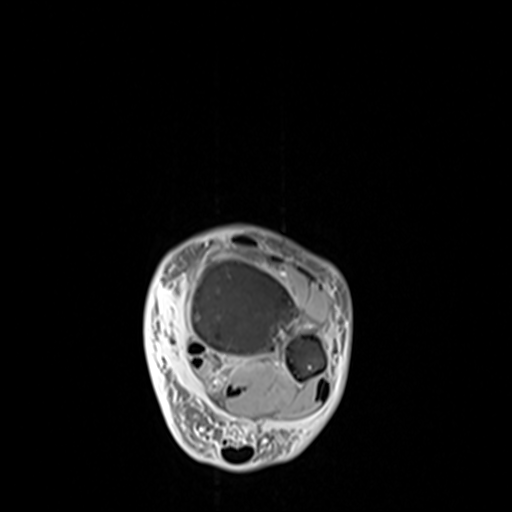

[Series 4: T2 fat-sat · axial · left · 3.0mm · 0.31mm/px · z∈[-21,+75]mm · 5 of 30 slices shown (1 of 2)]
[im 1/30]
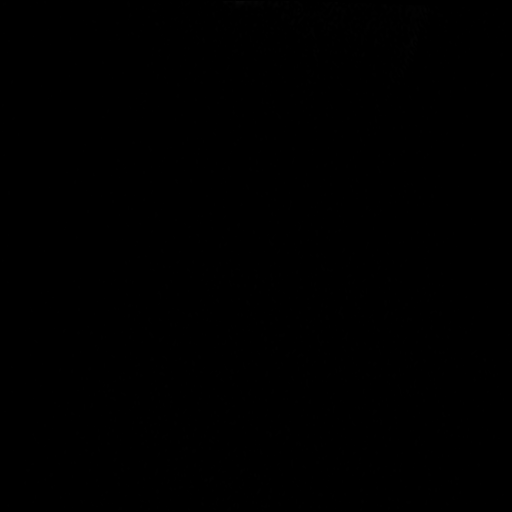
[im 5/30]
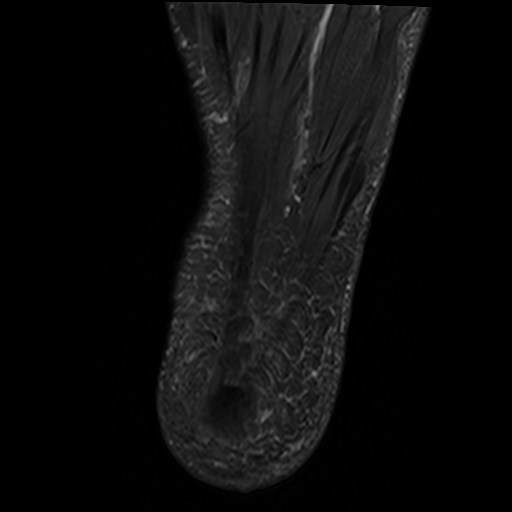
[im 9/30]
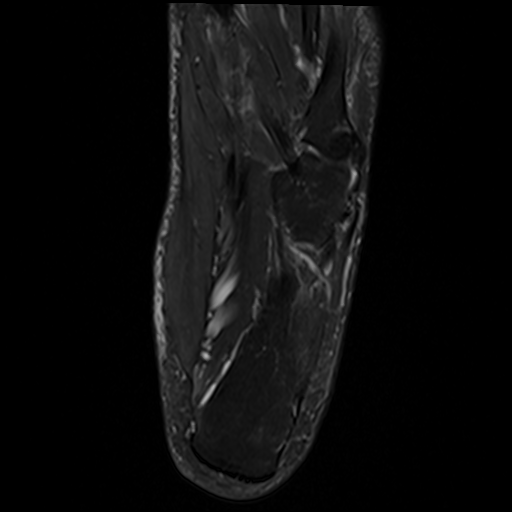
[im 17/30]
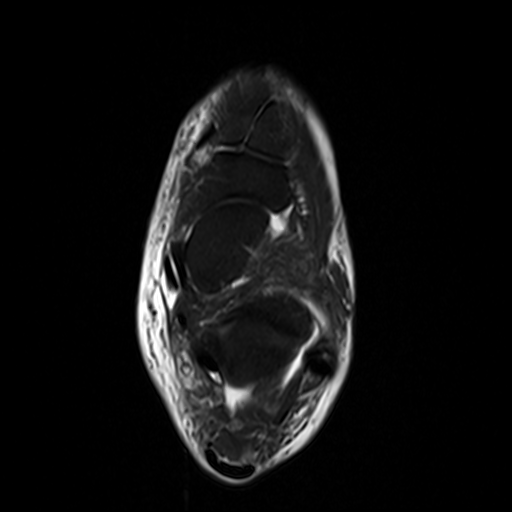
[im 25/30]
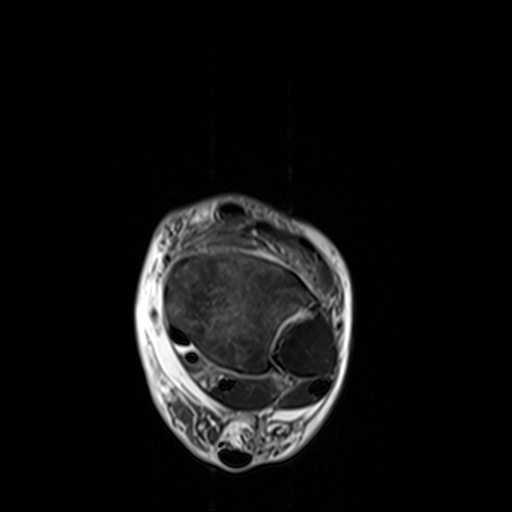

[Series 5: T1 · sagittal · left · 4.0mm · 0.33mm/px · 3 of 24 slices shown]
[im 4/24]
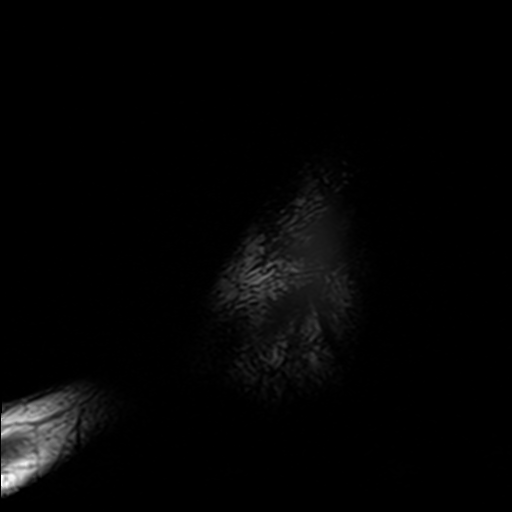
[im 12/24]
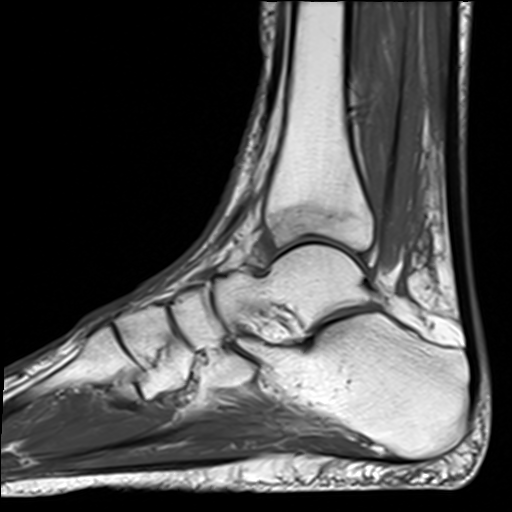
[im 20/24]
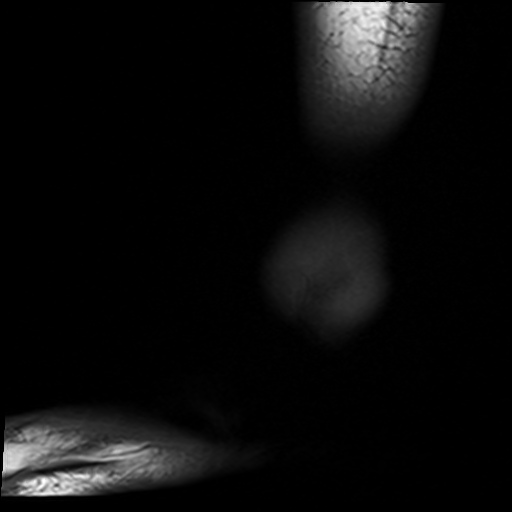

[Series 7: T2 fat-sat · coronal · left · 3.0mm · 0.31mm/px · 3 of 38 slices shown (2 of 2)]
[im 5/38]
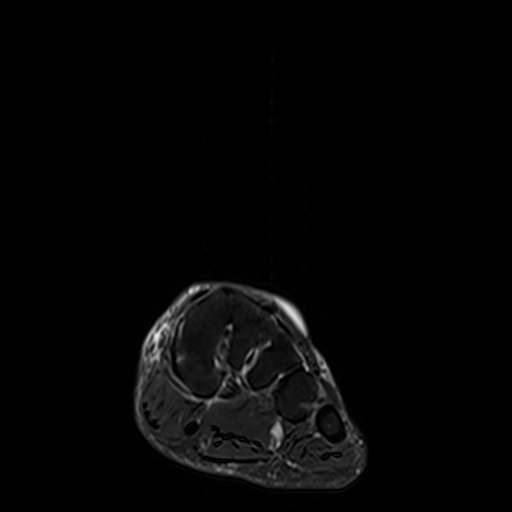
[im 21/38]
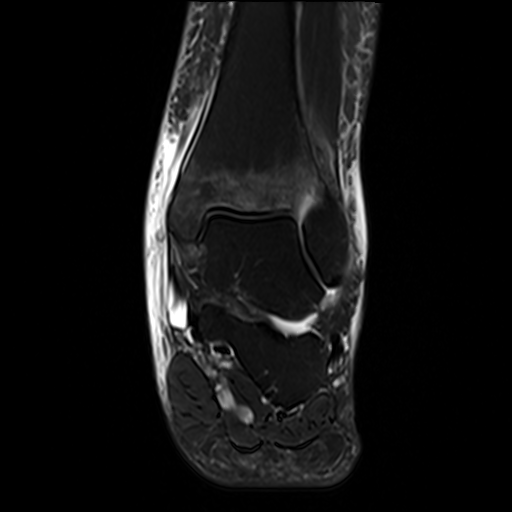
[im 33/38]
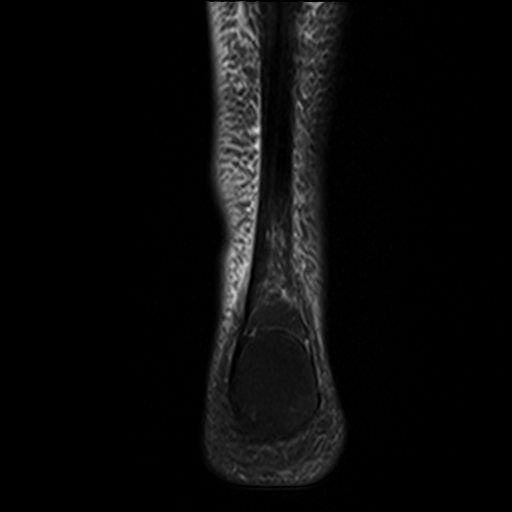

[19 of 40 positions shown; findings below may reference images not displayed]

FINDINGS: TENDONS

Peroneal: Intact

Posteromedial: Intact. Mild tenosynovitis involving the posterior
tibialis tendon.

Anterior: Intact

Achilles: Normal

Plantar Fascia: Intact

LIGAMENTS

Lateral: Intact

Medial: Intact

CARTILAGE

Ankle Joint: There is a small ankle joint effusion. The articular
cartilage appears intact.

Subtalar Joints/Sinus Tarsi: Small effusion involving the posterior
talocalcaneal facet. There is also mild edema in the sinus tarsi but
the cervical and interosseous ligaments are intact.

Bones: Fairly marked edema like signal changes in the distal tibia.
On the T1 weighted sagittal sequence I suspect there is a small
subchondral stress fracture or focus of spontaneous osteonecrosis
accounting for the surrounding marrow edema. Difficult to see this
on the T2 weighted images but there is a small amount of subchondral
fluid.

Other: Unremarkable foot and ankle musculature. There is moderate in
fairly diffuse subcutaneous soft tissue swelling/edema/fluid.
IMPRESSION: 1. Fairly marked edema like signal changes in the distal tibia. I
suspect there is a small subchondral stress fracture or focus of
spontaneous osteonecrosis.
2. Intact medial and lateral ankle ligaments and tendons.
3. Small ankle and posterior talocalcaneal joint effusions.
4. Moderate and fairly diffuse subcutaneous soft tissue
swelling/edema/fluid.

## 2021-02-26 ENCOUNTER — Other Ambulatory Visit: Payer: Self-pay | Admitting: Family Medicine

## 2021-02-26 DIAGNOSIS — M84361A Stress fracture, right tibia, initial encounter for fracture: Secondary | ICD-10-CM

## 2021-03-19 ENCOUNTER — Other Ambulatory Visit: Payer: Self-pay

## 2021-03-19 ENCOUNTER — Ambulatory Visit
Admission: RE | Admit: 2021-03-19 | Discharge: 2021-03-19 | Disposition: A | Discharge status: Home / Self Care | Payer: 59 | Source: Ambulatory Visit | Attending: Family Medicine | Admitting: Family Medicine

## 2021-03-19 DIAGNOSIS — M84361A Stress fracture, right tibia, initial encounter for fracture: Secondary | ICD-10-CM

## 2021-06-12 ENCOUNTER — Other Ambulatory Visit: Payer: Self-pay | Admitting: Orthopedic Surgery

## 2021-06-12 DIAGNOSIS — M25511 Pain in right shoulder: Secondary | ICD-10-CM

## 2021-06-15 ENCOUNTER — Encounter (HOSPITAL_BASED_OUTPATIENT_CLINIC_OR_DEPARTMENT_OTHER): Payer: Self-pay | Admitting: Obstetrics and Gynecology

## 2021-06-15 ENCOUNTER — Other Ambulatory Visit: Payer: Self-pay

## 2021-06-15 ENCOUNTER — Emergency Department (HOSPITAL_BASED_OUTPATIENT_CLINIC_OR_DEPARTMENT_OTHER): Payer: 59

## 2021-06-15 ENCOUNTER — Emergency Department (HOSPITAL_BASED_OUTPATIENT_CLINIC_OR_DEPARTMENT_OTHER): Payer: 59 | Admitting: Radiology

## 2021-06-15 ENCOUNTER — Emergency Department (HOSPITAL_BASED_OUTPATIENT_CLINIC_OR_DEPARTMENT_OTHER)
Admission: EM | Admit: 2021-06-15 | Discharge: 2021-06-16 | Disposition: A | Discharge status: Home / Self Care | Payer: 59 | Attending: Emergency Medicine | Admitting: Emergency Medicine

## 2021-06-15 DIAGNOSIS — Y92007 Garden or yard of unspecified non-institutional (private) residence as the place of occurrence of the external cause: Secondary | ICD-10-CM | POA: Diagnosis not present

## 2021-06-15 DIAGNOSIS — W19XXXA Unspecified fall, initial encounter: Secondary | ICD-10-CM

## 2021-06-15 DIAGNOSIS — S99921A Unspecified injury of right foot, initial encounter: Secondary | ICD-10-CM | POA: Diagnosis present

## 2021-06-15 DIAGNOSIS — S8992XA Unspecified injury of left lower leg, initial encounter: Secondary | ICD-10-CM | POA: Insufficient documentation

## 2021-06-15 DIAGNOSIS — S9031XA Contusion of right foot, initial encounter: Secondary | ICD-10-CM | POA: Insufficient documentation

## 2021-06-15 DIAGNOSIS — W010XXA Fall on same level from slipping, tripping and stumbling without subsequent striking against object, initial encounter: Secondary | ICD-10-CM | POA: Diagnosis not present

## 2021-06-15 DIAGNOSIS — M25562 Pain in left knee: Secondary | ICD-10-CM | POA: Diagnosis not present

## 2021-06-15 IMAGING — DX DG ANKLE COMPLETE 3+V*R*
3 series · 3 of 3 positions shown · non-contrast
Comparison: None.

CLINICAL DATA: Fall

EXAM:
RIGHT ANKLE - COMPLETE 3+ VIEW

[ankle ap]
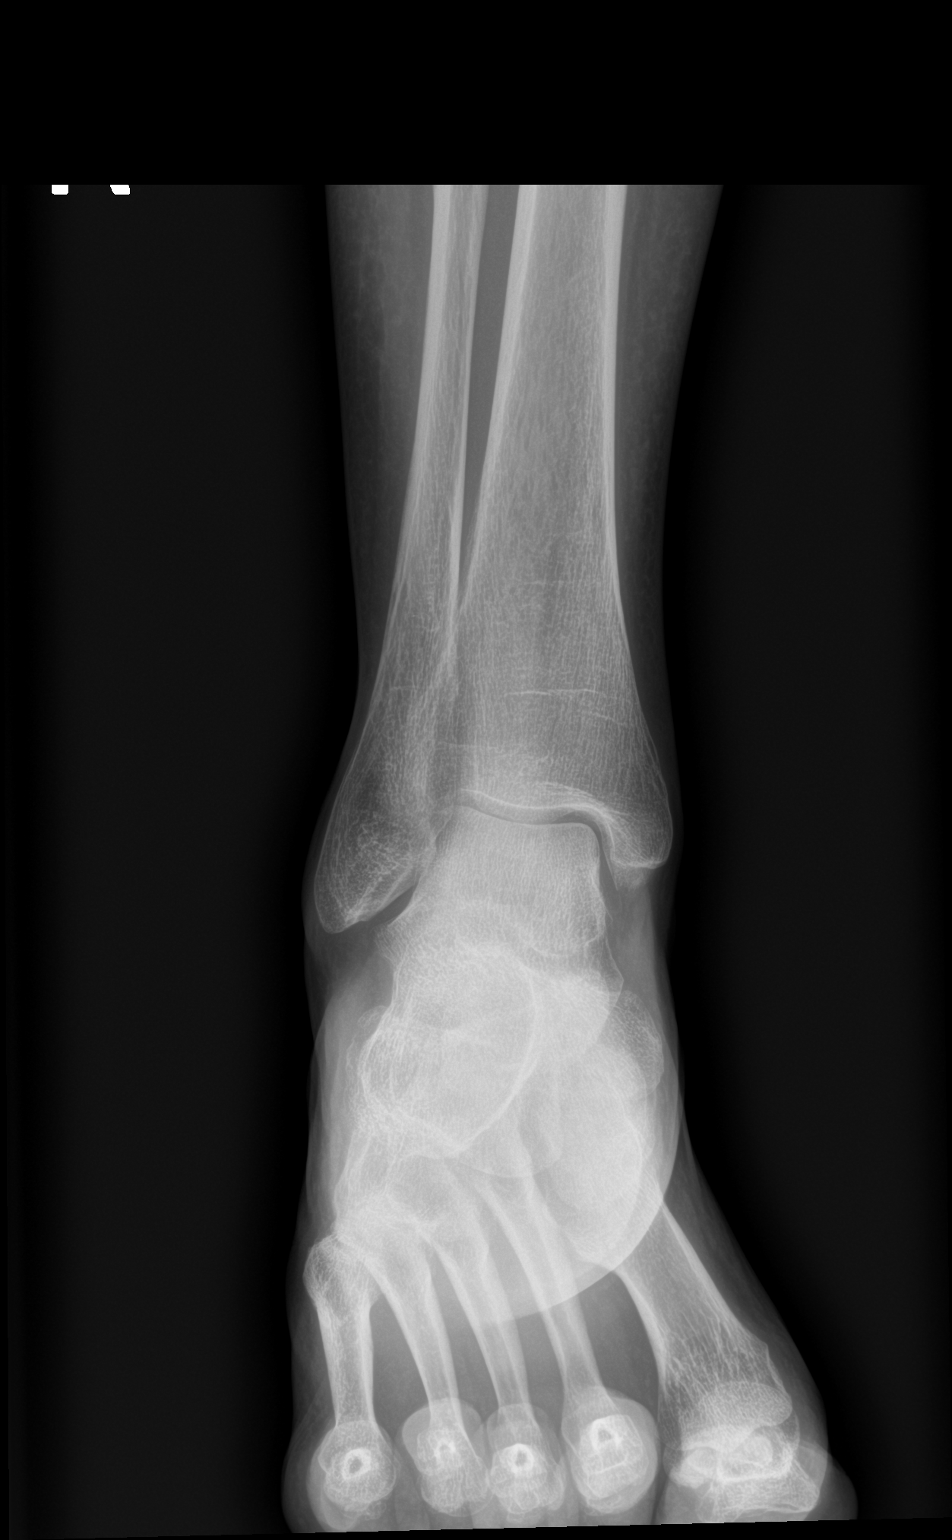

[ankle obl]
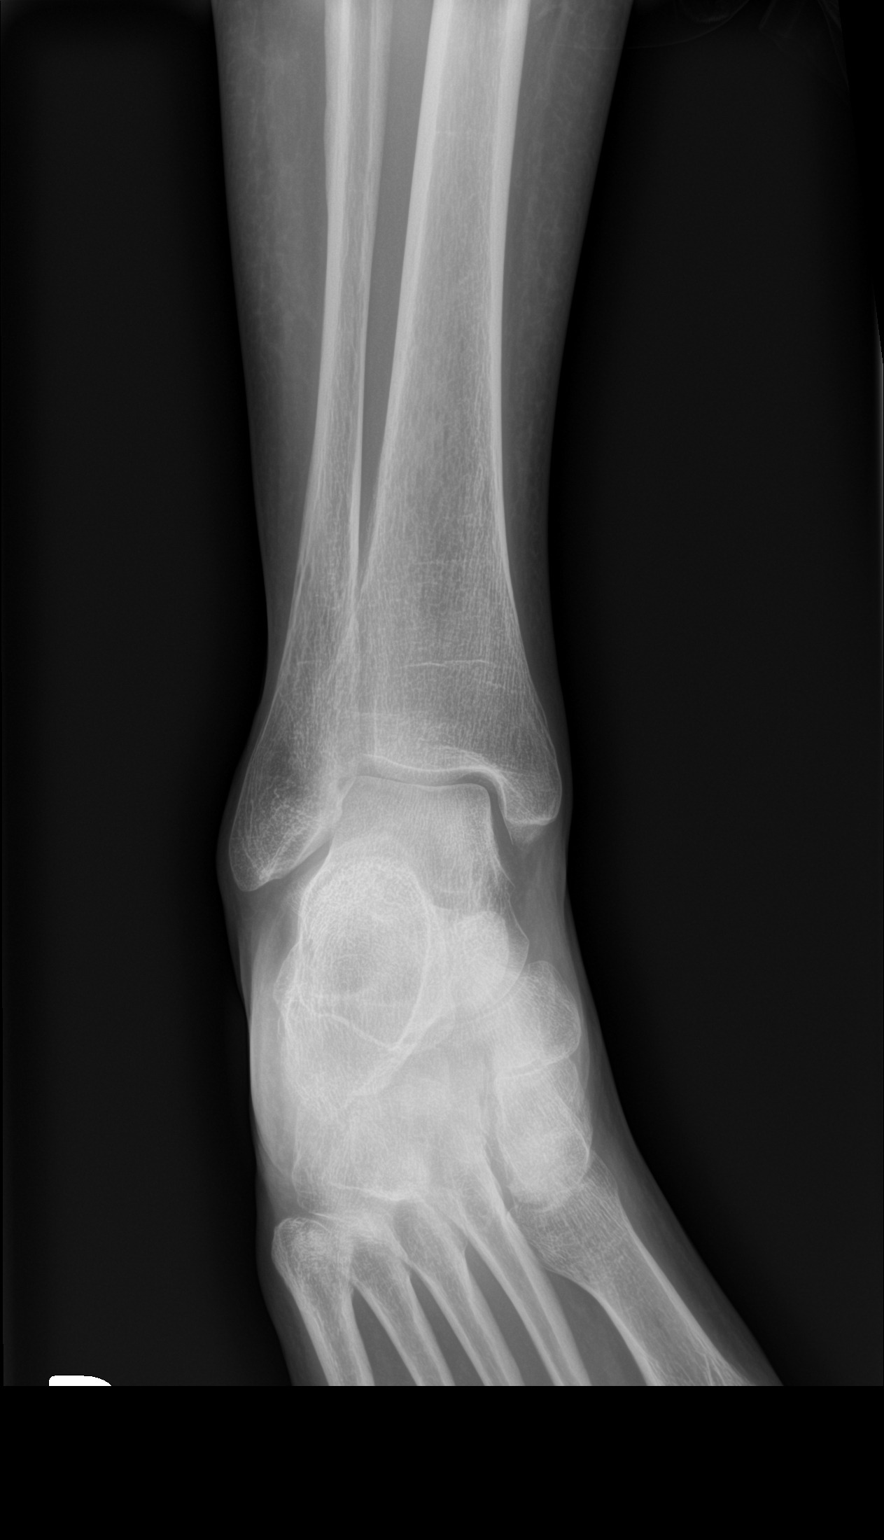

[ankle lat]
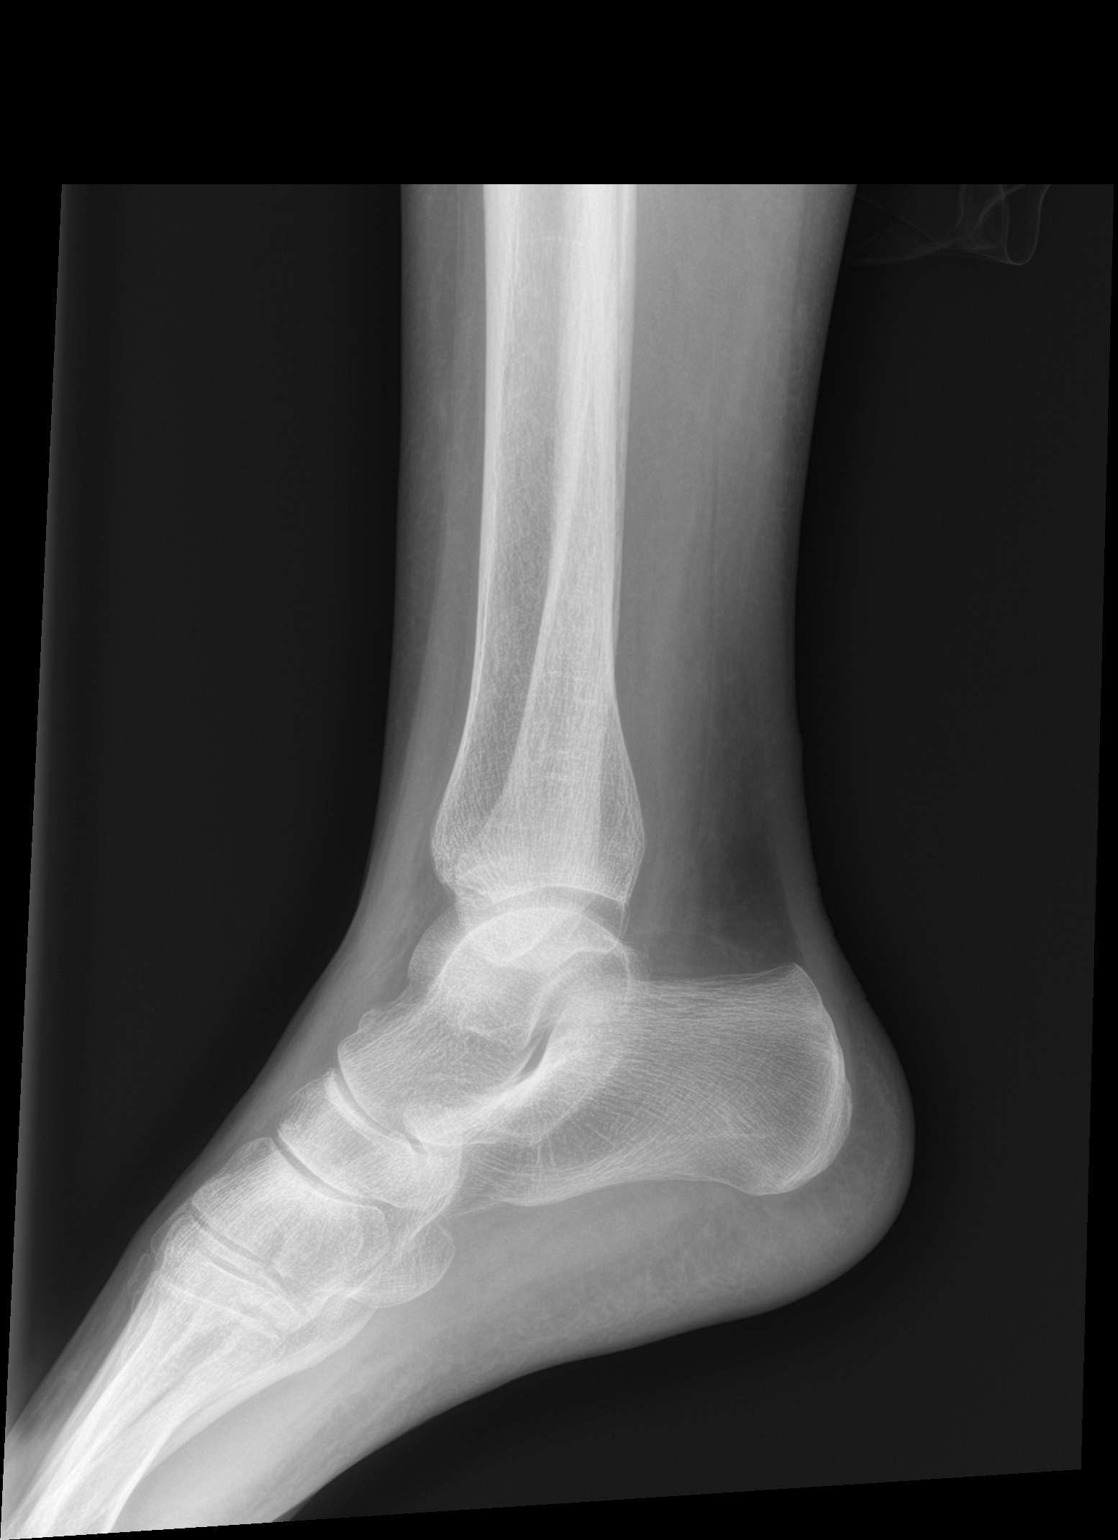

[3 of 3 positions shown; findings below may reference images not displayed]

FINDINGS: There is no evidence of fracture, dislocation, or joint effusion.
There is no evidence of arthropathy or other focal bone abnormality.
Soft tissues are unremarkable.
IMPRESSION: Negative.

## 2021-06-15 IMAGING — DX DG FOOT COMPLETE 3+V*R*
2 series · 3 of 3 positions shown · non-contrast
Comparison: None.

CLINICAL DATA: Fall, right foot pain

EXAM:
RIGHT FOOT COMPLETE - 3+ VIEW

[Series 1: foot · 0.14mm/px · 2 of 2 slices shown]
[im 1/2]
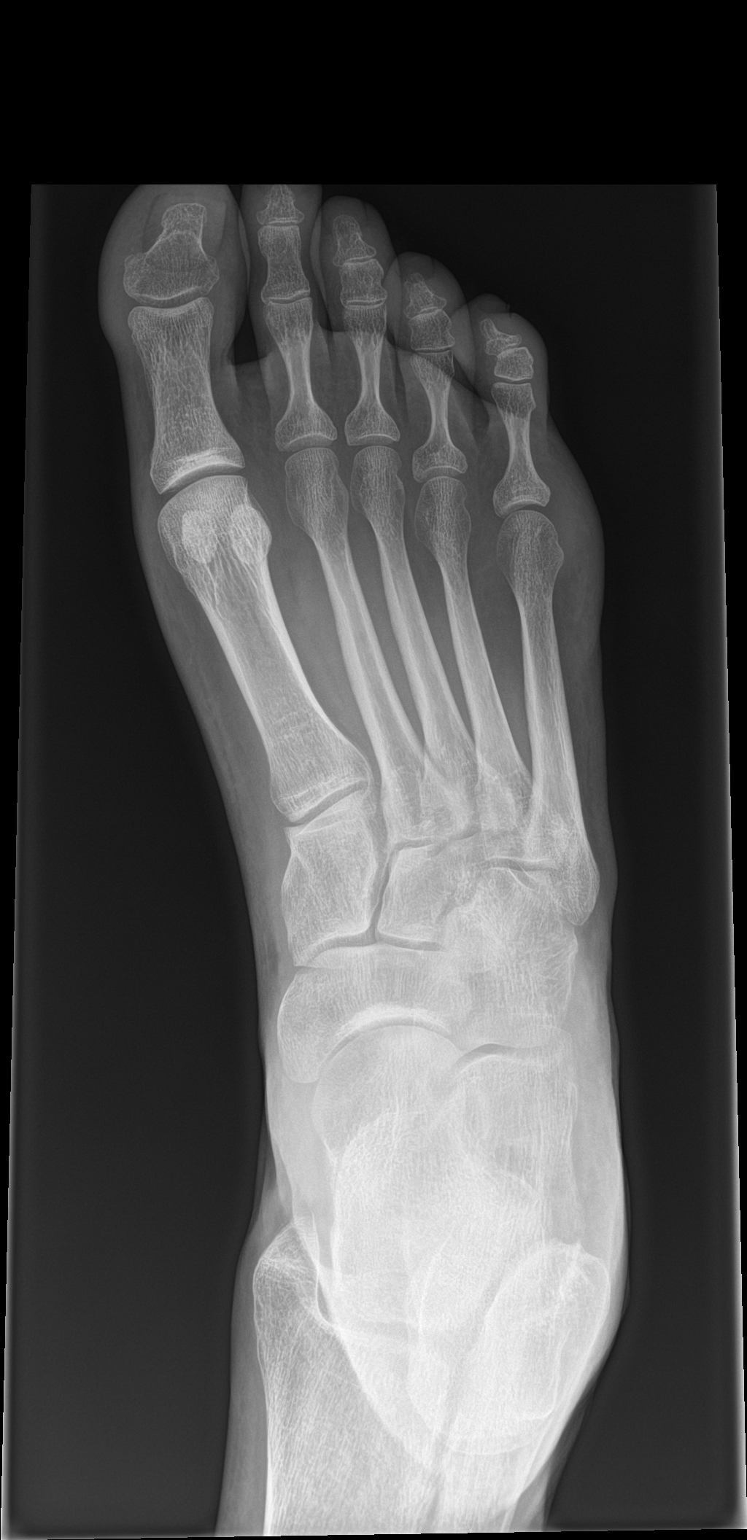
[im 2/2]
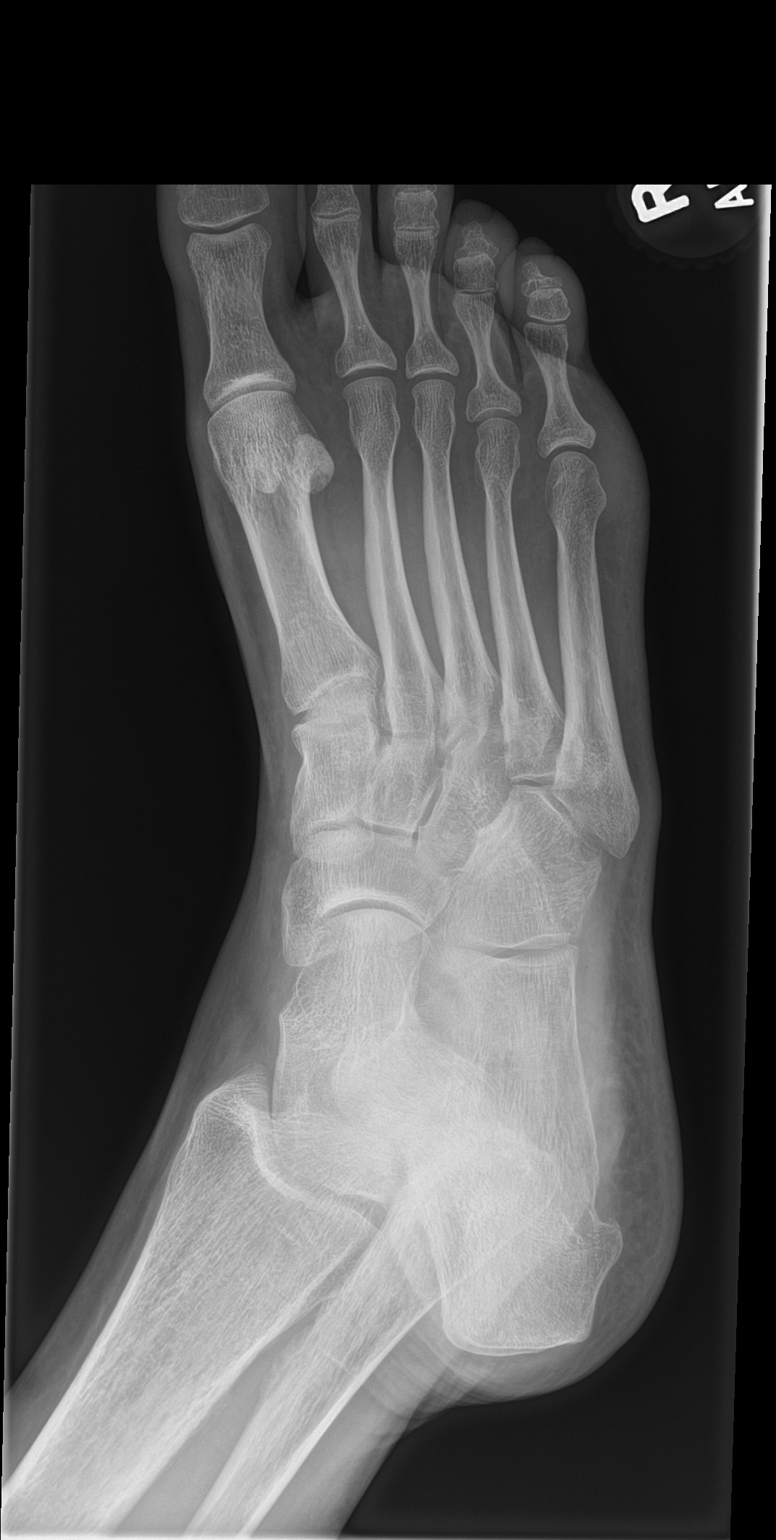

[leg]
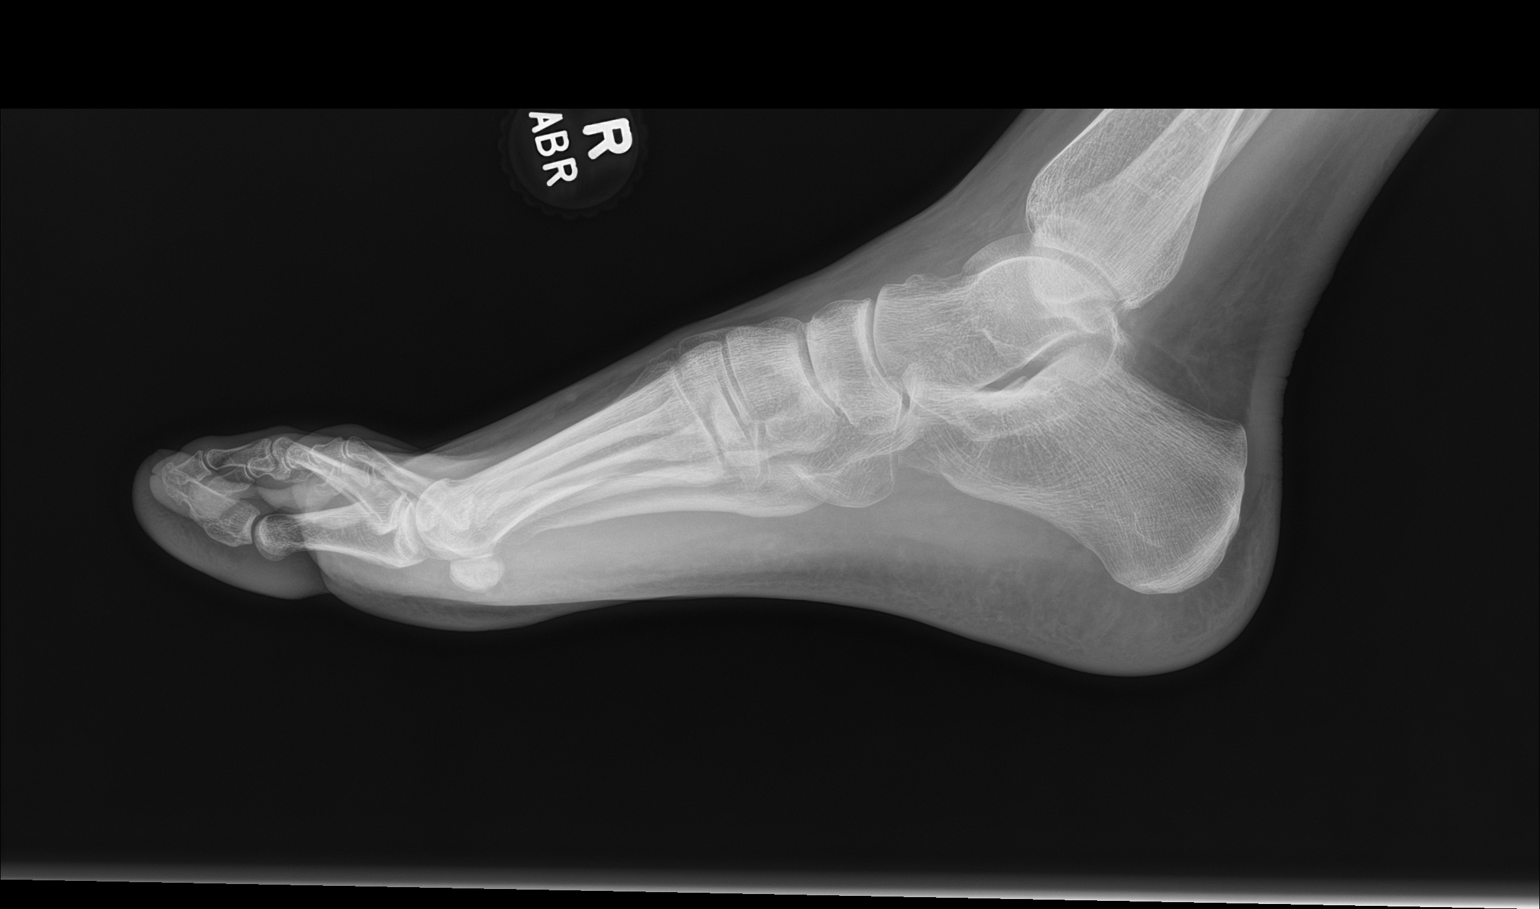

[3 of 3 positions shown; findings below may reference images not displayed]

FINDINGS: There is no evidence of fracture or dislocation. There is no
evidence of arthropathy or other focal bone abnormality. Soft
tissues are unremarkable.
IMPRESSION: Negative.

## 2021-06-15 IMAGING — DX DG KNEE COMPLETE 4+V*L*
4 series · 4 of 4 positions shown · non-contrast
Comparison: None.

CLINICAL DATA: Fall

EXAM:
LEFT KNEE - COMPLETE 4+ VIEW

[knee ap]
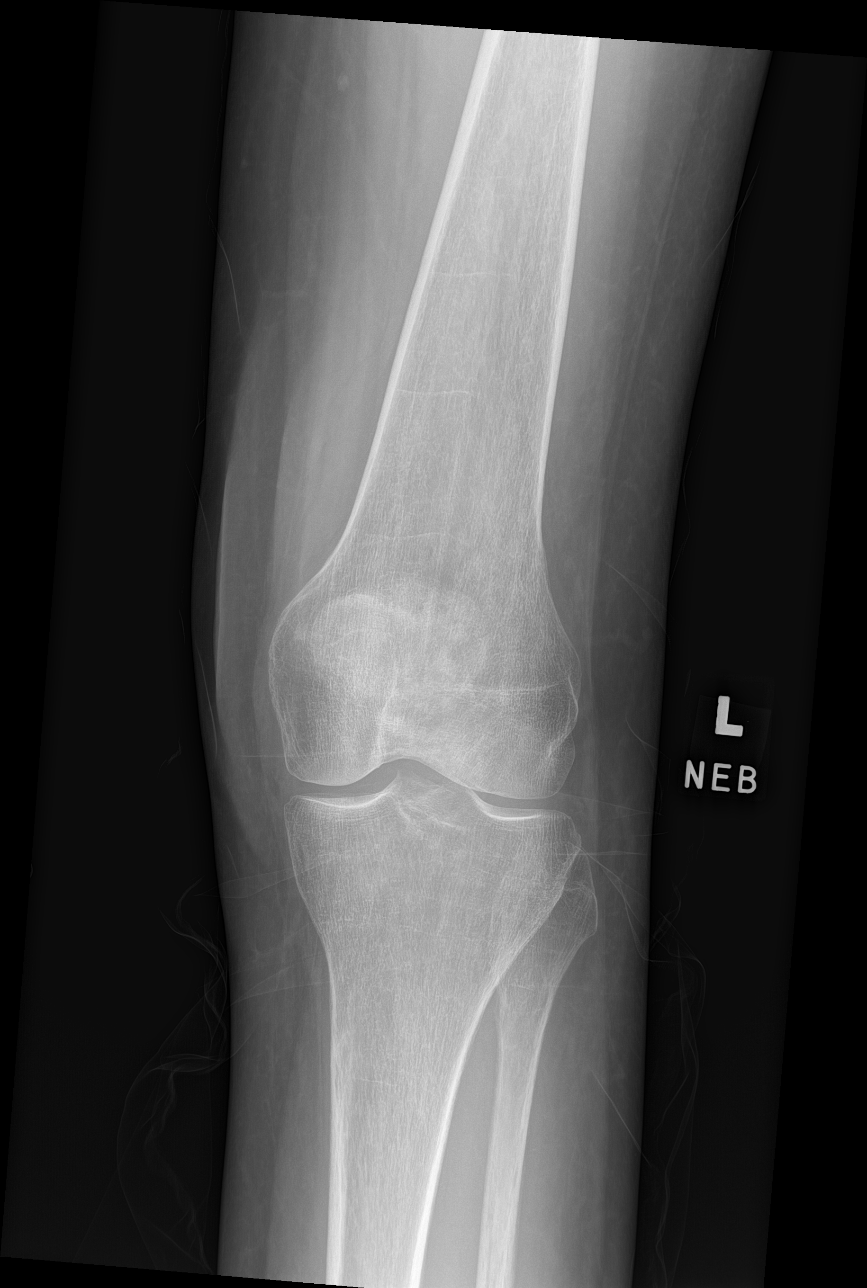

[knee obl (1 of 2)]
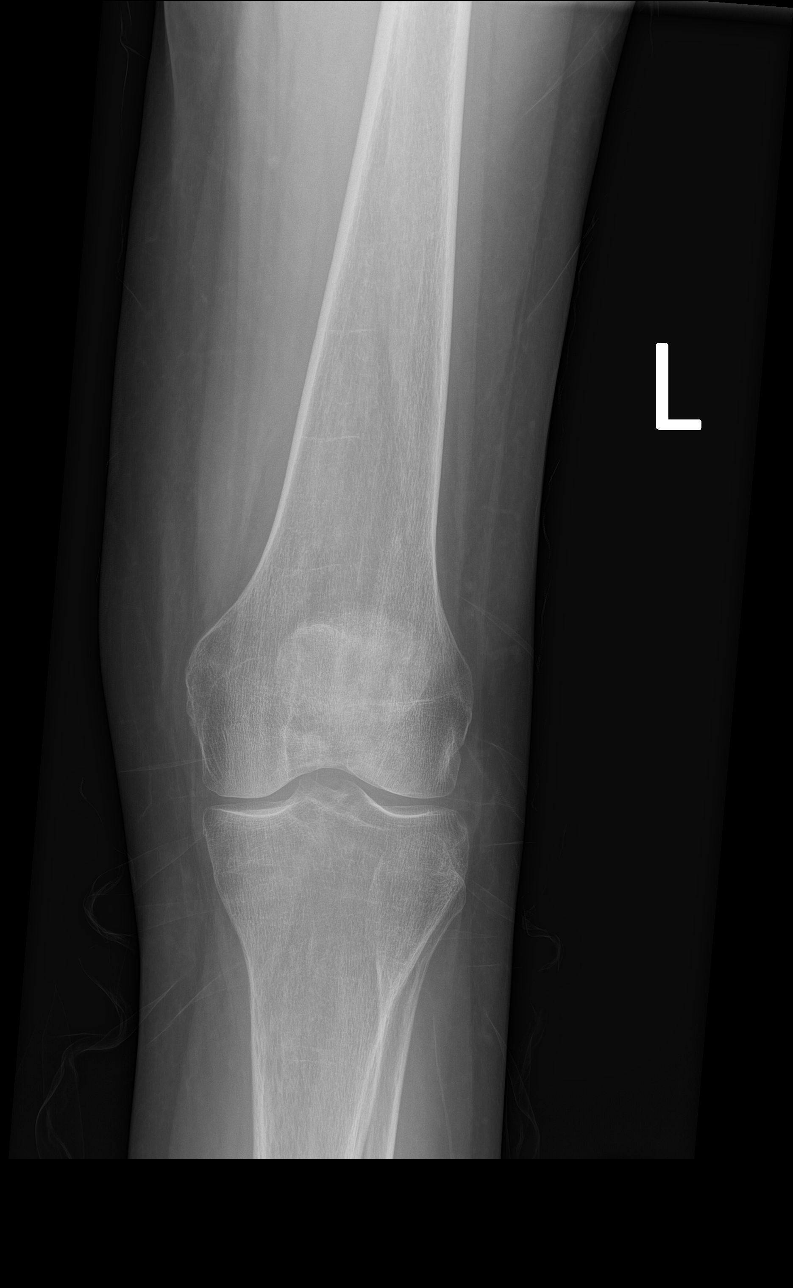

[knee obl (2 of 2)]
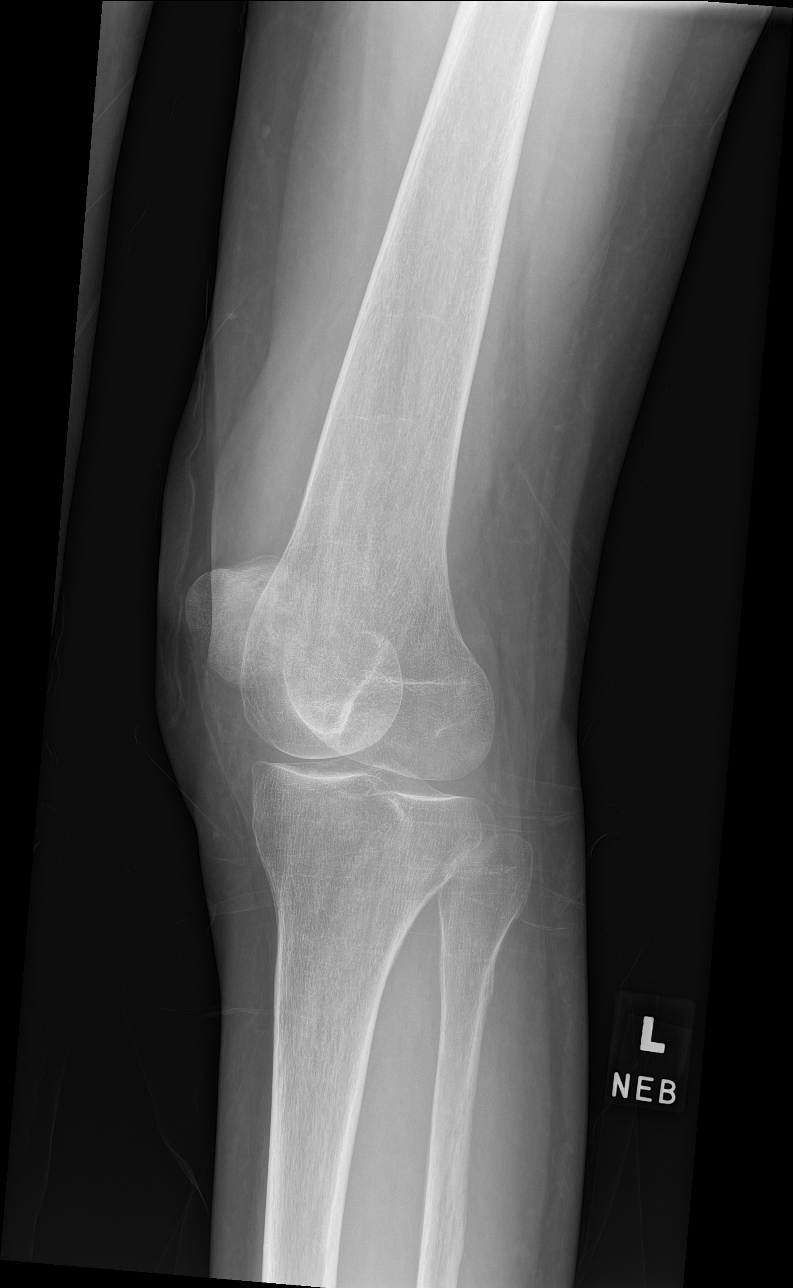

[knee lat]
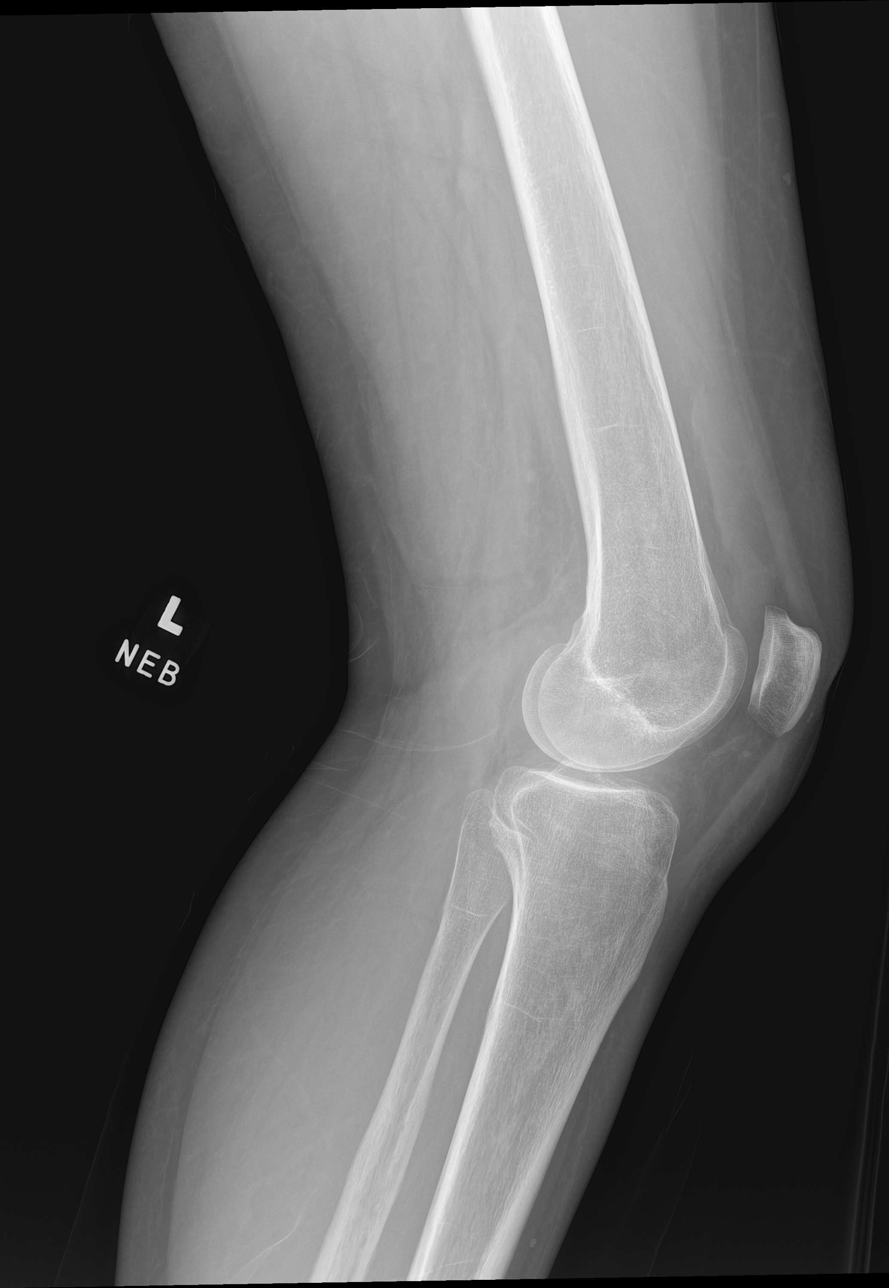

[4 of 4 positions shown; findings below may reference images not displayed]

FINDINGS: No evidence of fracture, dislocation, or large joint effusion. No
evidence of arthropathy or other focal bone abnormality. Soft
tissues are unremarkable.
IMPRESSION: No acute displaced fracture or dislocation.

## 2021-06-15 MED ORDER — HYDROCODONE-ACETAMINOPHEN 5-325 MG PO TABS
1.0000 | ORAL_TABLET | Freq: Once | ORAL | Status: DC
  Filled 2021-06-15: qty 1

## 2021-06-15 MED ORDER — IBUPROFEN 800 MG PO TABS
800.0000 mg | ORAL_TABLET | Freq: Once | ORAL | Status: AC
  Administered 2021-06-15: 800 mg via ORAL
  Filled 2021-06-15: qty 1

## 2021-06-15 NOTE — ED Provider Notes (Signed)
[facility] Provider Note   CSN: [id number] Arrival date & time: 06/15/21  2116     History Chief Complaint  Patient presents with   Knee Pain   Fall         Gina Fisher is a 55 y.o. female.  Patient states she was tripped by her dogs that were playing in the yard.  Fell onto her left knee and also twisted her right ankle.  Did not hit her head or lose consciousness.  No neck or back pain.  Unable to put weight on left leg due to knee pain.  Did not take any pain medications.  Complains of pain and swelling to right foot and ankle laterally.  Severe left anterior knee pain with difficulty bearing weight.  No numbness or tingling. No blood thinner use.  The history is provided by the patient.  Knee Pain Associated symptoms: no fever   Fall Pertinent negatives include no chest pain, no abdominal pain, no headaches and no shortness of breath.      History reviewed. No pertinent past medical history.  Patient Active Problem List   Diagnosis Date Noted   Gastroenteritis 02/14/2017    Past Surgical History:  Procedure Laterality Date   ENDOMETRIAL ABLATION     TUBAL LIGATION       OB History     Gravida  2   Para  2   Term  1   Preterm  1   AB  0   Living  2      SAB  0   IAB  0   Ectopic  0   Multiple  0   Live Births  2           Family History  Problem Relation Age of Onset   Breast cancer Neg Hx     Social History   Tobacco Use   Smoking status: Never   Smokeless tobacco: Never  Vaping Use   Vaping Use: Never used  Substance Use Topics   Alcohol use: Yes    Comment: occasional   Drug use: No    Home Medications Prior to Admission medications   Medication Sig Start Date End Date Taking? Authorizing Provider  ondansetron (ZOFRAN) 4 MG tablet Take 1 tablet (4 mg total) by mouth every 6 (six) hours as needed for nausea. 02/21/17   Mikhail, Velta Addison, DO  pantoprazole (PROTONIX) 40 MG tablet Take 1  tablet (40 mg total) by mouth daily. 02/22/17   Cristal Ford, DO    Allergies    Patient has no known allergies.  Review of Systems   Review of Systems  Constitutional:  Negative for activity change, appetite change and fever.  HENT:  Negative for congestion.   Respiratory:  Negative for cough, chest tightness and shortness of breath.   Cardiovascular:  Negative for chest pain.  Gastrointestinal:  Negative for abdominal pain, nausea and vomiting.  Musculoskeletal:  Positive for arthralgias and myalgias.  Skin:  Negative for rash.  Neurological:  Negative for light-headedness and headaches.  Psychiatric/Behavioral:  Confusion: .ro.    all other systems are negative except as noted in the HPI and PMH.   Physical Exam Updated Vital Signs BP 104/69   Pulse (!) 58   Temp 98.1 F (36.7 C)   Resp 16   SpO2 100%   Physical Exam Vitals and nursing note reviewed.  Constitutional:      General: She is not in acute distress.  Appearance: She is well-developed.  HENT:     Head: Normocephalic and atraumatic.     Mouth/Throat:     Pharynx: No oropharyngeal exudate.  Eyes:     Conjunctiva/sclera: Conjunctivae normal.     Pupils: Pupils are equal, round, and reactive to light.  Neck:     Comments: No meningismus. Cardiovascular:     Rate and Rhythm: Normal rate and regular rhythm.     Heart sounds: Normal heart sounds. No murmur heard. Pulmonary:     Effort: Pulmonary effort is normal. No respiratory distress.     Breath sounds: Normal breath sounds.  Abdominal:     Palpations: Abdomen is soft.     Tenderness: There is no abdominal tenderness. There is no guarding or rebound.  Musculoskeletal:        General: Swelling and tenderness present.     Cervical back: Normal range of motion and neck supple.     Comments: Swelling and ecchymosis right lateral malleolus and right lateral foot.  Intact DP and PT pulses. No proximal fibular tenderness Achilles tendon intact on the  right  Achilles tendon intact on the left. Able to raise left leg and keep knee extended.  Tenderness over patella and proximal tibia with swelling.  Reduced range of motion due to pain.  No obvious ligament laxity  Skin:    General: Skin is warm.  Neurological:     Mental Status: She is alert and oriented to person, place, and time.     Cranial Nerves: No cranial nerve deficit.     Motor: No abnormal muscle tone.     Coordination: Coordination normal.     Comments:  5/5 strength throughout. CN 2-12 intact.Equal grip strength.   Psychiatric:        Behavior: Behavior normal.    ED Results / Procedures / Treatments   Labs (all labs ordered are listed, but only abnormal results are displayed) Labs Reviewed - No data to display  EKG None  Radiology DG Ankle Complete Right  Result Date: 06/15/2021 CLINICAL DATA:  Fall EXAM: RIGHT ANKLE - COMPLETE 3+ VIEW COMPARISON:  None. FINDINGS: There is no evidence of fracture, dislocation, or joint effusion. There is no evidence of arthropathy or other focal bone abnormality. Soft tissues are unremarkable. IMPRESSION: Negative. Electronically Signed   By: Iven Finn M.D.   On: 06/15/2021 22:14   DG Knee Complete 4 Views Left  Result Date: 06/15/2021 CLINICAL DATA:  Fall EXAM: LEFT KNEE - COMPLETE 4+ VIEW COMPARISON:  None. FINDINGS: No evidence of fracture, dislocation, or large joint effusion. No evidence of arthropathy or other focal bone abnormality. Soft tissues are unremarkable. IMPRESSION: No acute displaced fracture or dislocation. Electronically Signed   By: Iven Finn M.D.   On: 06/15/2021 22:14    Procedures Procedures   Medications Ordered in ED Medications  HYDROcodone-acetaminophen (NORCO/VICODIN) 5-325 MG per tablet 1 tablet (has no administration in time range)  ibuprofen (ADVIL) tablet 800 mg (has no administration in time range)    ED Course  I have reviewed the triage vital signs and the nursing  notes.  Pertinent labs & imaging results that were available during my care of the patient were reviewed by me and considered in my medical decision making (see chart for details).    MDM Rules/Calculators/A&P                          Fall with left knee pain and  right foot pain.  No head or neck injury.  Neurovascular intact.  X-rays are negative but there is severe pain involving left proximal tibia.  Will obtain CT to rule plateau fracture.  CT shows no fracture but does show lipohemarthrosis.  Discussed with patient she may have partial or complete ligament injury.  Will place knee immobilizer, crutches, orthopedic follow-up.  No fracture to right foot or ankle.  Suspect likely contusion or sprain.  ASO brace given. Discussed ice, elevation, weightbearing as tolerated, orthopedic follow-up for possible MRI of left knee.  Return precautions discussed   Final Clinical Impression(s) / ED Diagnoses Final diagnoses:  Fall, initial encounter  Injury of left knee, initial encounter  Contusion of right foot, initial encounter    Rx / DC Orders ED Discharge Orders     None        Tarissa Kerin, Annie Main, MD 06/16/21 [phone]

## 2021-06-15 NOTE — ED Triage Notes (Signed)
Patient's dogs were playing and tripped her up and she fell hurting her left knee and right ankle.

## 2021-06-16 MED ORDER — IBUPROFEN 600 MG PO TABS: 600.0000 mg | ORAL_TABLET | Freq: Four times a day (QID) | ORAL | 0 refills | Status: AC | PRN

## 2021-06-16 NOTE — Discharge Instructions (Signed)
X-rays are negative for fracture but there is some concern for possible ligament injury of your left knee.  Wear the brace and follow-up with the orthopedic doctors for an MRI.  Take the anti-inflammatories as prescribed, use ice, elevation and bear weight as tolerated.  Return to the ED with new or worsening symptoms.

## 2021-06-16 NOTE — ED Notes (Signed)
Patient already had crutches didn't need ours.

## 2021-06-26 ENCOUNTER — Other Ambulatory Visit: Payer: Self-pay | Admitting: Orthopedic Surgery

## 2021-06-26 DIAGNOSIS — M25562 Pain in left knee: Secondary | ICD-10-CM

## 2021-07-19 ENCOUNTER — Ambulatory Visit
Admission: RE | Admit: 2021-07-19 | Discharge: 2021-07-19 | Disposition: A | Discharge status: Home / Self Care | Payer: 59 | Source: Ambulatory Visit | Attending: Orthopedic Surgery | Admitting: Orthopedic Surgery

## 2021-07-19 ENCOUNTER — Other Ambulatory Visit: Payer: Self-pay

## 2021-07-19 DIAGNOSIS — M25562 Pain in left knee: Secondary | ICD-10-CM

## 2021-07-19 DIAGNOSIS — M25511 Pain in right shoulder: Secondary | ICD-10-CM

## 2021-07-19 IMAGING — MR MR SHOULDER*R* W/O CM
4 of 6 series · 15 of 40 positions shown · non-contrast
Comparison: None.

CLINICAL DATA: Right shoulder discomfort radiating to the chest and
scapula since [DATE] after lifting injury.

EXAM:
MRI OF THE RIGHT SHOULDER WITHOUT CONTRAST
TECHNIQUE: Multiplanar, multisequence MR imaging of the shoulder was performed.
No intravenous contrast was administered.

[Series 6: T2 fat-sat · axial · right · 3.0mm · 0.47mm/px · z∈[-66,+1]mm · 3 of 27 slices shown (1 of 2)]
[im 4/27]
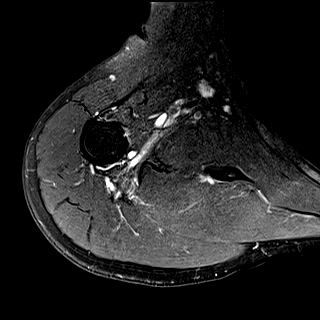
[im 14/27]
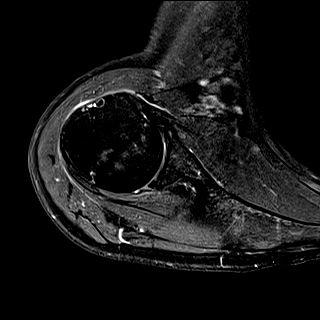
[im 23/27]
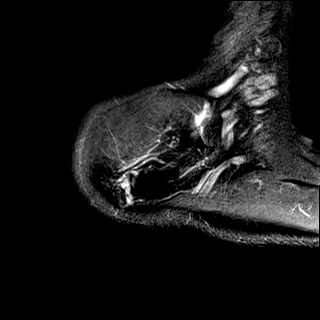

[Series 7: T2 fat-sat · sagittal · right · 4.0mm · 0.22mm/px · 3 of 21 slices shown (2 of 2)]
[im 4/21]
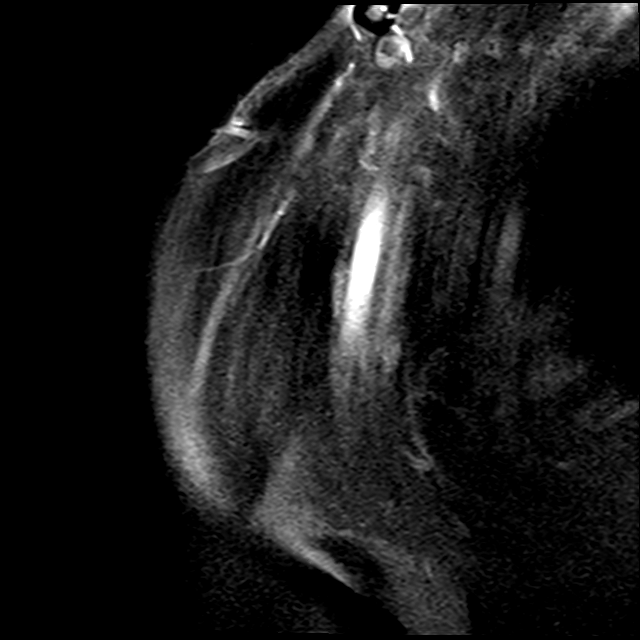
[im 11/21]
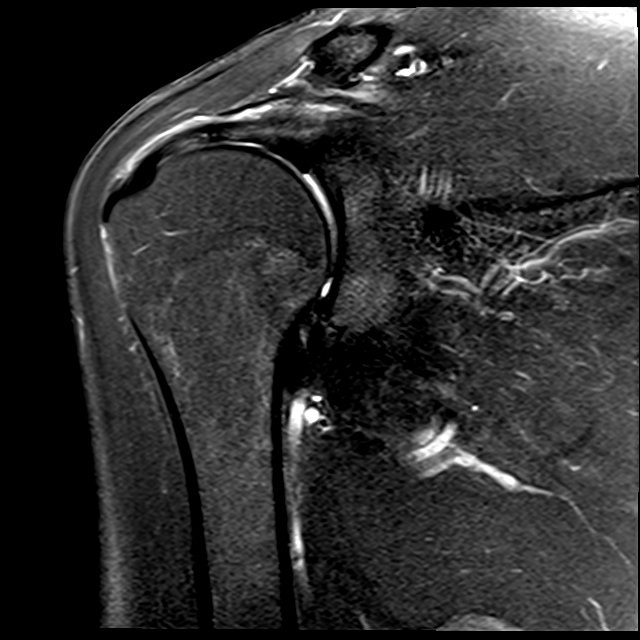
[im 17/21]
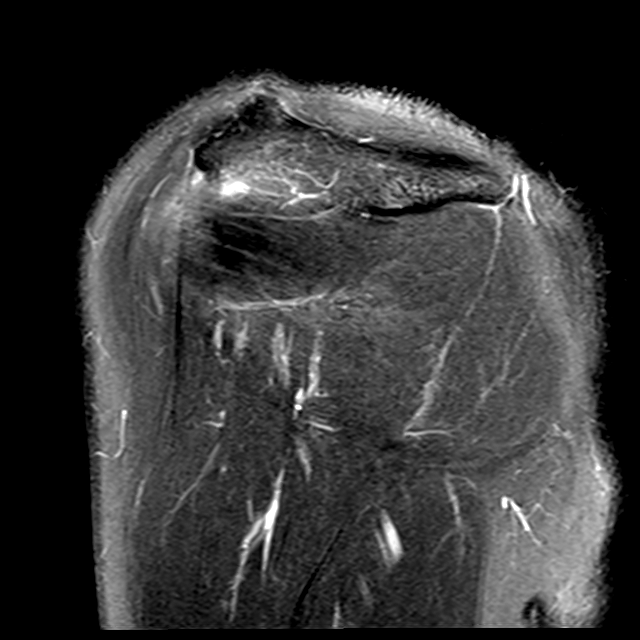

[Series 8: PD · sagittal · right · 4.0mm · 0.22mm/px · 6 of 21 slices shown (1 of 2)]
[im 1/21]
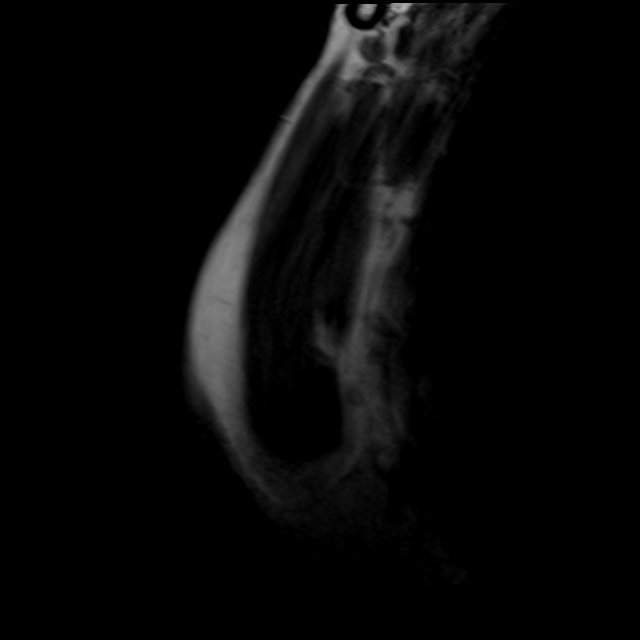
[im 5/21]
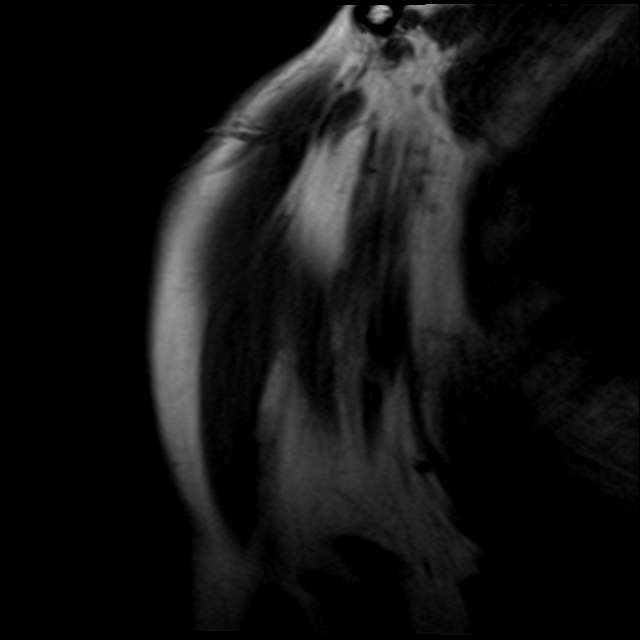
[im 9/21]
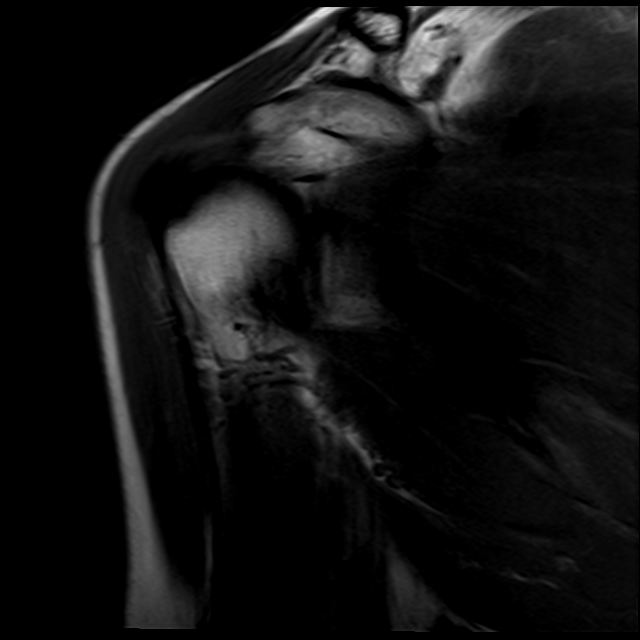
[im 13/21]
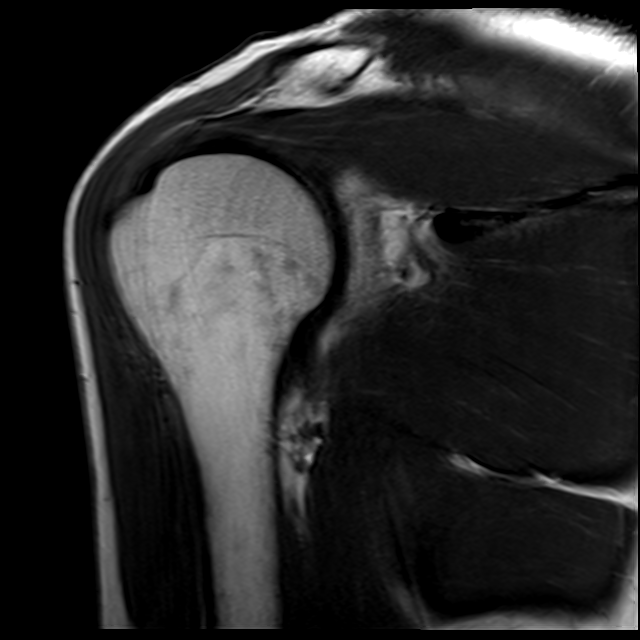
[im 17/21]
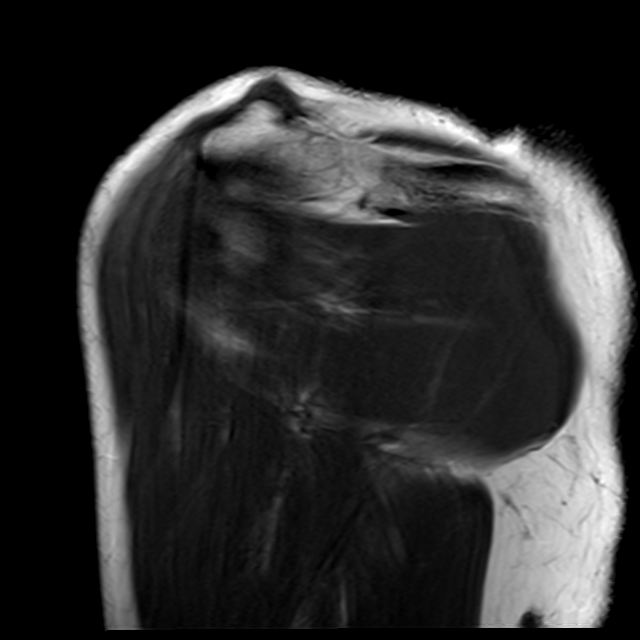
[im 21/21]
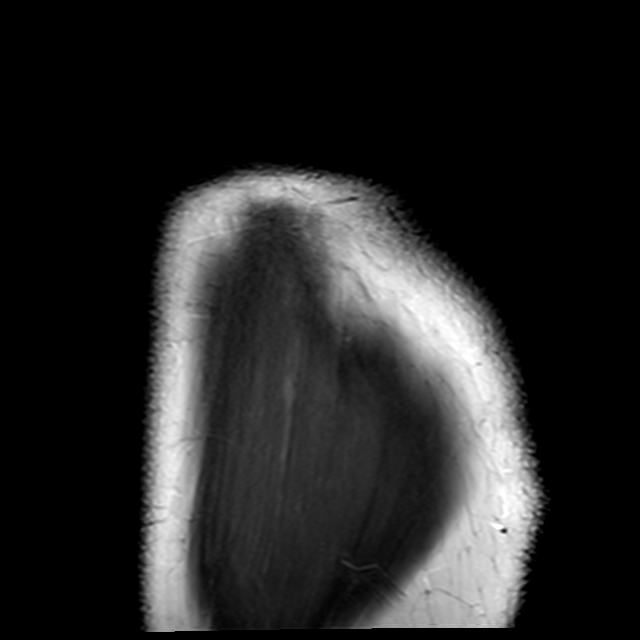

[Series 11: PD · sagittal · right · 4.0mm · 0.22mm/px · 3 of 21 slices shown (2 of 2)]
[im 5/21]
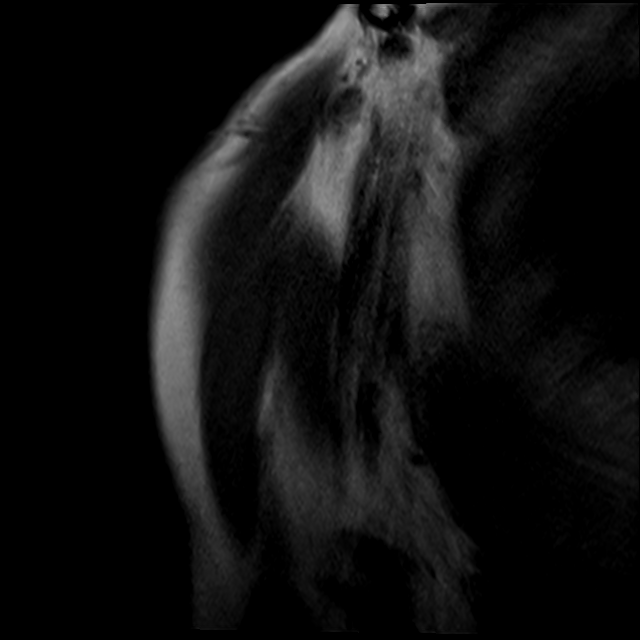
[im 13/21]
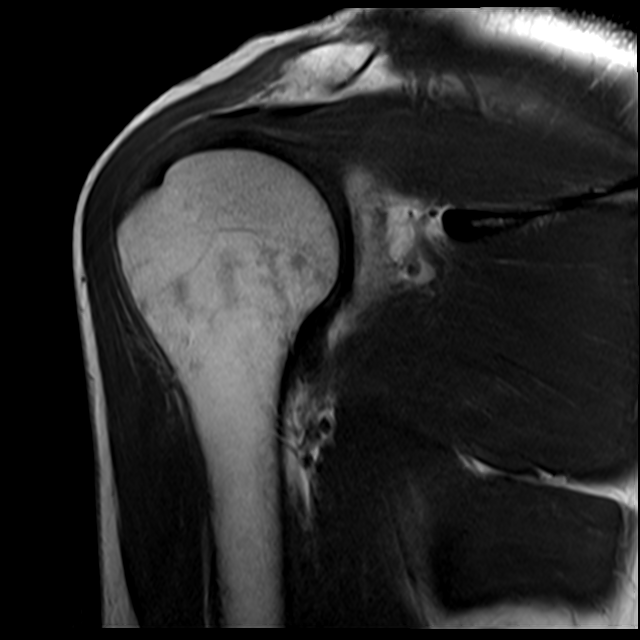
[im 21/21]
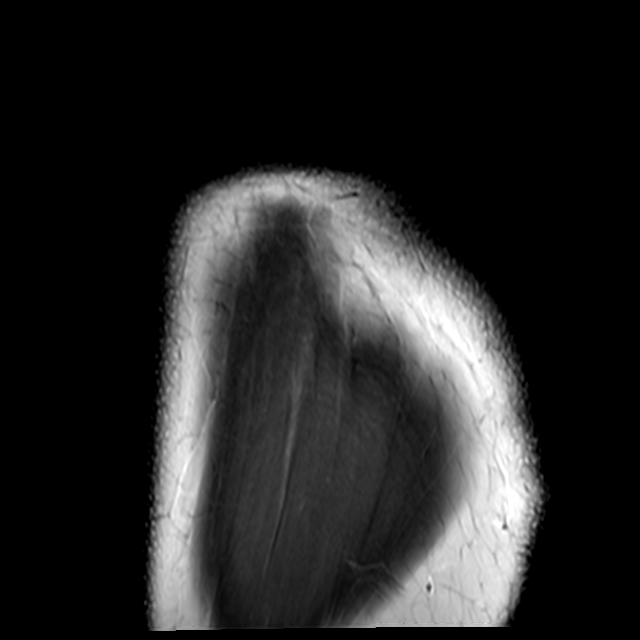

[15 of 40 positions shown; findings below may reference images not displayed]

FINDINGS: Rotator cuff:  Moderate supraspinatus tendinopathy.

Muscles:  Unremarkable

Biceps long head:  Unremarkable

Acromioclavicular Joint: No significant arthropathy. Type I
acromion. Trace subacromial subdeltoid bursitis. Trace subcoracoid
bursitis.

Glenohumeral Joint: Mild degenerative chondral thinning. No joint
effusion. Mild laxity of the upper margin of the MCL on image 9
series 9

Labrum:  Grossly unremarkable

Bones: No significant extra-articular osseous abnormalities
identified.

Other: No supplemental non-categorized findings.
IMPRESSION: 1. Moderate distal supraspinatus tendinopathy.
2. Trace subacromial subdeltoid bursitis and trace subcoracoid
bursitis.
3. Proximally lax but otherwise intact XIOMA.
4. Mild degenerative glenohumeral arthropathy.

## 2021-09-10 DIAGNOSIS — R001 Bradycardia, unspecified: Secondary | ICD-10-CM | POA: Diagnosis not present

## 2021-09-10 DIAGNOSIS — R0609 Other forms of dyspnea: Secondary | ICD-10-CM | POA: Diagnosis not present

## 2021-09-11 DIAGNOSIS — R0609 Other forms of dyspnea: Secondary | ICD-10-CM | POA: Diagnosis not present

## 2021-09-11 DIAGNOSIS — R001 Bradycardia, unspecified: Secondary | ICD-10-CM | POA: Diagnosis not present

## 2021-09-25 ENCOUNTER — Ambulatory Visit: Payer: 59 | Admitting: Cardiology

## 2021-09-25 ENCOUNTER — Encounter: Payer: Self-pay | Admitting: Cardiology

## 2021-09-25 ENCOUNTER — Other Ambulatory Visit: Payer: Self-pay

## 2021-09-25 VITALS — BP 121/61 | HR 53 | Temp 97.8°F | Resp 16 | Ht 67.0 in | Wt 129.0 lb

## 2021-09-25 DIAGNOSIS — R001 Bradycardia, unspecified: Secondary | ICD-10-CM

## 2021-09-25 NOTE — Addendum Note (Signed)
Addended by: Kela Millin on: 09/25/2021 12:58 PM   Modules accepted: Level of Service

## 2021-09-25 NOTE — Progress Notes (Addendum)
Primary Physician/Referring:  Gina Neer, MD  Patient ID: Gina Fisher, female    DOB: 1965/10/25, 56 y.o.   MRN: [id number]  Chief Complaint  Patient presents with   Bradycardia   New Patient (Initial Visit)   HPI:    Gina Fisher  is a 56 y.o. Caucasian female with no significant prior cardiovascular history, recently had upper respiratory infection, was treated with antibiotics and also prednisone, she was still feeling congested 2 weeks later and hence went to an urgent care with continued chest congestion, fatigue, dyspnea, patient was found to have borderline low blood pressure and also bradycardia, she was asked to go to the emergency room via EMS.  She stayed in [facility] for 2 days with blood work done, told not to have had a heart attack, all other blood work were normal, echocardiogram was performed, was recommended that she follow-up with outpatient cardiology for continued bradycardia as her heart rate was dropping down as low as 35 bpm.  She was also dehydrated.  Patient is completely asymptomatic.  No dizziness or syncope.  No family history of premature coronary disease or sudden cardiac death.  No dyspnea, no fatigue.  She is recuperated from her upper respiratory infection now is back to baseline.  History reviewed. No pertinent past medical history. Past Surgical History:  Procedure Laterality Date   ENDOMETRIAL ABLATION     TUBAL LIGATION     Family History  Problem Relation Age of Onset   Heart attack Father 53   Cancer Father    Breast cancer Neg Hx     Social History   Tobacco Use   Smoking status: Former    Packs/day: 0.25    Years: 2.00    Pack years: 0.50    Types: Cigarettes    Quit date: 1989    Years since quitting: 34.0   Smokeless tobacco: Never  Substance Use Topics   Alcohol use: Yes    Comment: occasional   Marital Status: Married  ROS  Review of Systems  Cardiovascular:  Negative for chest pain, dyspnea on exertion and  leg swelling.  Gastrointestinal:  Negative for melena.  Objective  Blood pressure 121/61, pulse (!) 53, temperature 97.8 F (36.6 C), resp. rate 16, height _0  (1.702 m), weight 129 lb (58.5 kg), SpO2 98 %. Body mass index is 20.2 kg/m.  Vitals with BMI 09/25/2021 06/16/2021 06/16/2021  Height _1  - -  Weight 129 lbs - -  BMI 74.0 - -  Systolic 814 96 94  Diastolic 61 78 52  Pulse 53 62 51    Physical Exam Neck:     Vascular: No carotid bruit or JVD.  Cardiovascular:     Rate and Rhythm: Normal rate and regular rhythm.     Pulses: Intact distal pulses.     Heart sounds: Normal heart sounds. No murmur heard.   No gallop.  Pulmonary:     Effort: Pulmonary effort is normal.     Breath sounds: Normal breath sounds.  Abdominal:     General: Bowel sounds are normal.     Palpations: Abdomen is soft.  Musculoskeletal:        General: No swelling.     Laboratory examination:   External labs:   Labs 09/09/2021:  Sodium 135, potassium 4.1, BUN 18, creatinine 0.80, EGFR >80 mL.  LFTs normal.  Hb 14.7/HCT 43.4, platelets 260.  Thyroid function test normal.  Serum troponin and proBNP normal.  Medications and  allergies  No Known Allergies   Medication prior to this encounter:   Outpatient Medications Prior to Visit  Medication Sig Dispense Refill   Cholecalciferol (VITAMIN D) 50 MCG (2000 UT) CAPS 1 capsule     estradiol (CLIMARA - DOSED IN MG/24 HR) 0.05 mg/24hr patch Place 0.05 mg onto the skin once a week.     ibuprofen (ADVIL) 600 MG tablet Take 1 tablet (600 mg total) by mouth every 6 (six) hours as needed. 30 tablet 0   Multiple Vitamin (MULTI VITAMIN) TABS 1 tablet     NON FORMULARY Take 1 tablet by mouth daily at 6 (six) AM. [place]  Calcium, magnesium, and zinc     ondansetron (ZOFRAN) 4 MG tablet Take 1 tablet (4 mg total) by mouth every 6 (six) hours as needed for nausea. 30 tablet 0   pantoprazole (PROTONIX) 40 MG tablet Take 1 tablet (40 mg total) by  mouth daily. 30 tablet 0   progesterone (PROMETRIUM) 100 MG capsule Take 100 mg by mouth daily.     No facility-administered medications prior to visit.     Medication list after today's encounter   Current Outpatient Medications  Medication Instructions   Cholecalciferol (VITAMIN D) 50 MCG (2000 UT) CAPS 1 capsule   estradiol (CLIMARA - DOSED IN MG/24 HR) 0.05 mg, Transdermal, Weekly   ibuprofen (ADVIL) 600 mg, Oral, Every 6 hours PRN   Multiple Vitamin (MULTI VITAMIN) TABS 1 tablet   NON FORMULARY 1 tablet, Oral, Daily, [place] <BR>Calcium, magnesium, and zinc   ondansetron (ZOFRAN) 4 mg, Oral, Every 6 hours PRN   pantoprazole (PROTONIX) 40 mg, Oral, Daily   progesterone (PROMETRIUM) 100 mg, Oral, Daily    Radiology:   Portable chest x-ray 1 view 09/09/2021: Normal chest x-ray.  CT angiogram chest 09/09/2021: No pulmonary embolus.  Visualized cardiovascular structures within normal limits.  No evidence of dissection.  Lungs are clear.  Cardiac Studies:   Echocardiogram [facility]) 09/10/2021:   Normal LV systolic function, EF 65 to 70%.  Normal diastolic filling pattern for the age. No significant valvular abnormality, trace TR and MR.  EKG:   EKG 10/03/2021: Normal sinus rhythm/sinus bradycardia at rate of 45 bpm, normal axis, incomplete right bundle branch block.  Early repolarization.    Assessment     ICD-10-CM   1. Sinus bradycardia  R00.1 EKG 12-Lead       There are no discontinued medications.  No orders of the defined types were placed in this encounter.  Orders Placed This Encounter  Procedures   EKG 12-Lead   Recommendations:   Gina Fisher is a 56 y.o. Caucasian female patient with no significant prior cardiovascular history with recent upper respiratory infection and incidentally found to have marked sinus bradycardia and dehydration while being treated for upper respiratory infection, was transferred via EMS to [facility] where she  stayed for 2 days with extensive evaluation, ruled out for myocardial infarction, all labs told to be normal except for dehydration.  She also had an echocardiogram.  Patient has asymptomatic sinus bradycardia.  She has no dyspnea, dizziness, syncope, no family history of sudden cardiac death.  Her physical examination is otherwise completely unremarkable and normal.  Echocardiogram also shows revealing structurally normal heart.  No further evaluation is indicated.  Advised her to continue to have routine activities and exercise program.  If she develops any exertional related dyspnea, dizziness, syncope or near syncope, she can certainly call us back.  Her TSH  was normal a year ago, suspect recent labs at the hospitalization probably included TSH, we will try to obtain these.  Addendum: I have reviewed the external labs from recent hospitalization.  Extensive evaluation was performed including CT angiogram of the chest extensive labs including serum cortisol, troponins, proBNP and CBC and CMP.  All within normal limits.  Echocardiogram was also normal.  We will forward this note to Dr. Mayra Fisher.    Adrian Prows, MD, [facility] 09/25/2021, 9:11 AM Office: [phone]

## 2023-02-10 ENCOUNTER — Other Ambulatory Visit: Payer: Self-pay | Admitting: Family Medicine

## 2023-02-10 DIAGNOSIS — M810 Age-related osteoporosis without current pathological fracture: Secondary | ICD-10-CM

## 2023-04-01 ENCOUNTER — Other Ambulatory Visit: Payer: Self-pay | Admitting: Physician Assistant

## 2023-04-01 ENCOUNTER — Ambulatory Visit
Admission: RE | Admit: 2023-04-01 | Discharge: 2023-04-01 | Disposition: A | Discharge status: Home / Self Care | Payer: 59 | Source: Ambulatory Visit | Attending: Physician Assistant | Admitting: Physician Assistant

## 2023-04-01 DIAGNOSIS — R140 Abdominal distension (gaseous): Secondary | ICD-10-CM

## 2023-10-07 ENCOUNTER — Other Ambulatory Visit (HOSPITAL_BASED_OUTPATIENT_CLINIC_OR_DEPARTMENT_OTHER): Payer: Self-pay

## 2023-10-07 MED ORDER — INFLUENZA VIRUS VACC SPLIT PF (FLUZONE) 0.5 ML IM SUSY
0.5000 mL | PREFILLED_SYRINGE | Freq: Once | INTRAMUSCULAR | 0 refills | Status: AC
  Filled 2023-10-07: qty 0.5, 1d supply, fill #0

## 2024-01-16 ENCOUNTER — Other Ambulatory Visit: Payer: Self-pay

## 2024-01-16 DIAGNOSIS — I872 Venous insufficiency (chronic) (peripheral): Secondary | ICD-10-CM

## 2024-01-26 ENCOUNTER — Ambulatory Visit (HOSPITAL_COMMUNITY): Payer: 59 | Admitting: Vascular Surgery

## 2024-01-26 ENCOUNTER — Ambulatory Visit (HOSPITAL_COMMUNITY): Payer: 59

## 2024-03-28 ENCOUNTER — Other Ambulatory Visit: Payer: Self-pay | Admitting: Obstetrics and Gynecology

## 2024-03-28 DIAGNOSIS — Z1231 Encounter for screening mammogram for malignant neoplasm of breast: Secondary | ICD-10-CM

## 2024-05-31 ENCOUNTER — Ambulatory Visit

## 2024-06-01 ENCOUNTER — Ambulatory Visit

## 2024-06-07 ENCOUNTER — Ambulatory Visit
Admission: RE | Admit: 2024-06-07 | Discharge: 2024-06-07 | Disposition: A | Source: Ambulatory Visit | Attending: Obstetrics and Gynecology | Admitting: Obstetrics and Gynecology

## 2024-06-07 DIAGNOSIS — Z1231 Encounter for screening mammogram for malignant neoplasm of breast: Secondary | ICD-10-CM
